# Patient Record
Sex: Female | Born: 1960
Health system: Southern US, Community
[De-identification: ages and names within clinical notes are randomized; demographics above are authoritative.]

## PROBLEM LIST (undated history)

## (undated) DIAGNOSIS — E7800 Pure hypercholesterolemia, unspecified: Secondary | ICD-10-CM

## (undated) HISTORY — PX: OTHER SURGICAL HISTORY: SHX169

---

## 1997-03-14 ENCOUNTER — Ambulatory Visit (HOSPITAL_COMMUNITY): Admission: RE | Admit: 1997-03-14 | Discharge: 1997-03-14 | Payer: Self-pay | Admitting: Obstetrics

## 1997-04-04 ENCOUNTER — Inpatient Hospital Stay (HOSPITAL_COMMUNITY): Admission: AD | Admit: 1997-04-04 | Discharge: 1997-04-04 | Payer: Self-pay | Admitting: Obstetrics

## 1997-05-16 ENCOUNTER — Other Ambulatory Visit: Admission: RE | Admit: 1997-05-16 | Discharge: 1997-05-16 | Payer: Self-pay | Admitting: Obstetrics

## 1997-05-24 ENCOUNTER — Encounter: Admission: RE | Admit: 1997-05-24 | Discharge: 1997-08-22 | Payer: Self-pay | Admitting: Family Medicine

## 1997-06-15 ENCOUNTER — Encounter (HOSPITAL_COMMUNITY): Admission: RE | Admit: 1997-06-15 | Discharge: 1997-06-18 | Payer: Self-pay | Admitting: *Deleted

## 1997-06-15 ENCOUNTER — Inpatient Hospital Stay (HOSPITAL_COMMUNITY): Admission: AD | Admit: 1997-06-15 | Discharge: 1997-06-18 | Payer: Self-pay | Admitting: Obstetrics & Gynecology

## 1998-07-22 ENCOUNTER — Encounter: Payer: Self-pay | Admitting: Family Medicine

## 1998-07-22 ENCOUNTER — Ambulatory Visit (HOSPITAL_COMMUNITY): Admission: RE | Admit: 1998-07-22 | Discharge: 1998-07-22 | Payer: Self-pay | Admitting: Family Medicine

## 2000-03-06 ENCOUNTER — Encounter: Payer: Self-pay | Admitting: Emergency Medicine

## 2000-03-06 ENCOUNTER — Emergency Department (HOSPITAL_COMMUNITY): Admission: EM | Admit: 2000-03-06 | Discharge: 2000-03-06 | Payer: Self-pay | Admitting: Emergency Medicine

## 2000-03-23 ENCOUNTER — Other Ambulatory Visit: Admission: RE | Admit: 2000-03-23 | Discharge: 2000-03-23 | Payer: Self-pay | Admitting: Family Medicine

## 2001-05-25 ENCOUNTER — Ambulatory Visit (HOSPITAL_COMMUNITY): Admission: RE | Admit: 2001-05-25 | Discharge: 2001-05-25 | Payer: Self-pay | Admitting: Family Medicine

## 2001-05-25 ENCOUNTER — Encounter: Payer: Self-pay | Admitting: Family Medicine

## 2006-08-17 ENCOUNTER — Other Ambulatory Visit: Admission: RE | Admit: 2006-08-17 | Discharge: 2006-08-17 | Payer: Self-pay | Admitting: Family Medicine

## 2007-08-18 ENCOUNTER — Other Ambulatory Visit: Admission: RE | Admit: 2007-08-18 | Discharge: 2007-08-18 | Payer: Self-pay | Admitting: Family Medicine

## 2008-08-20 ENCOUNTER — Other Ambulatory Visit: Admission: RE | Admit: 2008-08-20 | Discharge: 2008-08-20 | Payer: Self-pay | Admitting: Family Medicine

## 2011-10-27 ENCOUNTER — Emergency Department (HOSPITAL_BASED_OUTPATIENT_CLINIC_OR_DEPARTMENT_OTHER): Payer: Managed Care, Other (non HMO)

## 2011-10-27 ENCOUNTER — Emergency Department (HOSPITAL_BASED_OUTPATIENT_CLINIC_OR_DEPARTMENT_OTHER)
Admission: EM | Admit: 2011-10-27 | Discharge: 2011-10-27 | Disposition: A | Discharge status: Home / Self Care | Payer: Managed Care, Other (non HMO) | Attending: Emergency Medicine | Admitting: Emergency Medicine

## 2011-10-27 ENCOUNTER — Encounter (HOSPITAL_BASED_OUTPATIENT_CLINIC_OR_DEPARTMENT_OTHER): Payer: Self-pay | Admitting: Emergency Medicine

## 2011-10-27 DIAGNOSIS — R109 Unspecified abdominal pain: Secondary | ICD-10-CM | POA: Insufficient documentation

## 2011-10-27 DIAGNOSIS — K859 Acute pancreatitis without necrosis or infection, unspecified: Secondary | ICD-10-CM | POA: Insufficient documentation

## 2011-10-27 DIAGNOSIS — R319 Hematuria, unspecified: Secondary | ICD-10-CM | POA: Insufficient documentation

## 2011-10-27 DIAGNOSIS — R197 Diarrhea, unspecified: Secondary | ICD-10-CM | POA: Insufficient documentation

## 2011-10-27 DIAGNOSIS — R42 Dizziness and giddiness: Secondary | ICD-10-CM | POA: Insufficient documentation

## 2011-10-27 HISTORY — DX: Pure hypercholesterolemia, unspecified: E78.00

## 2011-10-27 LAB — COMPREHENSIVE METABOLIC PANEL
Albumin: 3.7 g/dL (ref 3.5–5.2)
Alkaline Phosphatase: 53 U/L (ref 39–117)
BUN: 12 mg/dL (ref 6–23)
Chloride: 102 mEq/L (ref 96–112)
GFR calc Af Amer: >90 mL/min (ref >90)
Glucose, Bld: 100 mg/dL — ABNORMAL HIGH (ref 70–99)
Potassium: 3.7 mEq/L (ref 3.5–5.1)
Total Bilirubin: 0.4 mg/dL (ref 0.3–1.2)

## 2011-10-27 LAB — URINALYSIS, ROUTINE W REFLEX MICROSCOPIC
Bilirubin Urine: NEGATIVE
Ketones, ur: NEGATIVE mg/dL
Nitrite: NEGATIVE
pH: 5 (ref 5.0–8.0)

## 2011-10-27 LAB — CBC WITH DIFFERENTIAL/PLATELET
Hemoglobin: 12.5 g/dL (ref 12.0–15.0)
Lymphs Abs: 1.6 10*3/uL (ref 0.7–4.0)
Monocytes Relative: 9 % (ref 3–12)
Neutro Abs: 3.5 10*3/uL (ref 1.7–7.7)
Neutrophils Relative %: 61 % (ref 43–77)
RBC: 3.95 MIL/uL (ref 3.87–5.11)

## 2011-10-27 LAB — URINE MICROSCOPIC-ADD ON

## 2011-10-27 LAB — LIPASE, BLOOD: Lipase: 82 U/L — ABNORMAL HIGH (ref 11–59)

## 2011-10-27 MED ORDER — OXYCODONE-ACETAMINOPHEN 5-325 MG PO TABS: 1.0000 | ORAL_TABLET | Freq: Four times a day (QID) | ORAL | Status: DC | PRN

## 2011-10-27 MED ORDER — SODIUM CHLORIDE 0.9 % IV BOLUS (SEPSIS)
1000.0000 mL | Freq: Once | INTRAVENOUS | Status: AC
  Administered 2011-10-27: 1000 mL via INTRAVENOUS

## 2011-10-27 MED ORDER — PROMETHAZINE HCL 25 MG RE SUPP: 12.5000 mg | Freq: Four times a day (QID) | RECTAL | Status: DC | PRN

## 2011-10-27 MED ORDER — FENTANYL CITRATE 0.05 MG/ML IJ SOLN
50.0000 ug | Freq: Once | INTRAMUSCULAR | Status: AC
  Administered 2011-10-27: 50 ug via INTRAVENOUS
  Filled 2011-10-27: qty 2

## 2011-10-27 MED ORDER — OXYCODONE-ACETAMINOPHEN 5-325 MG PO TABS: 1.0000 | ORAL_TABLET | ORAL | Status: DC | PRN

## 2011-10-27 MED ORDER — PROMETHAZINE HCL 25 MG RE SUPP: 25.0000 mg | Freq: Four times a day (QID) | RECTAL | Status: DC | PRN

## 2011-10-27 MED ORDER — ONDANSETRON HCL 4 MG/2ML IJ SOLN
4.0000 mg | Freq: Once | INTRAMUSCULAR | Status: AC
  Administered 2011-10-27: 4 mg via INTRAVENOUS
  Filled 2011-10-27: qty 2

## 2011-10-27 NOTE — ED Notes (Signed)
Presents via ems with n/v, dizziness

## 2011-10-27 NOTE — ED Provider Notes (Signed)
History     CSN: [id number]  Arrival date & time 10/27/11  4098   First MD Initiated Contact with Patient 10/27/11 [phone]      Chief Complaint  Patient presents with  . Abdominal Pain    (Consider location/radiation/quality/duration/timing/severity/associated sxs/prior treatment) Patient is a 51 y.o. female presenting with abdominal pain. The history is provided by the patient. No language interpreter was used.  Abdominal Pain The primary symptoms of the illness include abdominal pain, nausea, vomiting and diarrhea. The primary symptoms of the illness do not include fever, fatigue, shortness of breath or dysuria. The current episode started yesterday. The onset of the illness was gradual. The problem has been rapidly worsening.  The abdominal pain began yesterday. The pain came on gradually. The abdominal pain has been gradually worsening since its onset. The abdominal pain is located in the suprapubic region. The abdominal pain does not radiate. The severity of the abdominal pain is 7/10. The abdominal pain is relieved by vomiting (vomiting originally and now nothing.  ). Exacerbated by: nothing.  Nausea began yesterday. Associated with: nothing.  The vomiting began yesterday. Vomiting occurred once. The emesis contains stomach contents.  The diarrhea began today. The diarrhea is watery. The diarrhea occurs 2 to 4 times per day.  Associated with: nothing. The patient states that she believes she is currently not pregnant. Risk factors: none. Symptoms associated with the illness do not include chills, anorexia, diaphoresis or constipation. Significant associated medical issues do not include diabetes.  Originally had a bout of pain last evening relieved by emesis and patient went to be and then awoke with abdominal pain coming and going and 2 bouts of diarrhea.  No upper abdominal pain.  Patient points to suprapubic area as the source of her pain.  No f/c/r.  Denies back pain at this time.     Past Medical History  Diagnosis Date  . Coronary artery disease     History reviewed. No pertinent past surgical history.  History reviewed. No pertinent family history.  History  Substance Use Topics  . Smoking status: Never Smoker   . Smokeless tobacco: Never Used  . Alcohol Use: No    OB History    Grav Para Term Preterm Abortions TAB SAB Ect Mult Living                  Review of Systems  Constitutional: Negative for fever, chills, diaphoresis and fatigue.  Respiratory: Negative for shortness of breath.   Cardiovascular: Negative for chest pain.  Gastrointestinal: Positive for nausea, vomiting, abdominal pain and diarrhea. Negative for constipation and anorexia.  Genitourinary: Negative for dysuria.  Neurological: Positive for light-headedness. Negative for dizziness, weakness and numbness.  All other systems reviewed and are negative.    Allergies  Review of patient's allergies indicates no known allergies.  Home Medications  No current outpatient prescriptions on file.  BP 145/93  Pulse 77  Temp 98.4 F (36.9 C)  Resp 20  Ht 5\' 4"  (1.626 m)  Wt 162 lb (73.483 kg)  BMI 27.81 kg/m2  SpO2 99%  LMP 10/20/2011  Physical Exam  Constitutional: She is oriented to person, place, and time. She appears well-developed and well-nourished. No distress.  HENT:  Head: Normocephalic and atraumatic.  Mouth/Throat: Oropharynx is clear and moist.  Eyes: Conjunctivae normal are normal. Pupils are equal, round, and reactive to light.  Neck: Normal range of motion. Neck supple.  Cardiovascular: Normal rate and regular rhythm.   Pulmonary/Chest: Effort normal  and breath sounds normal. She has no wheezes. She has no rales.  Abdominal: Soft. She exhibits no distension, no fluid wave and no ascites. Bowel sounds are increased. There is no tenderness. There is no rebound and no guarding.  Musculoskeletal: Normal range of motion.       Gait normal  Neurological: She is  alert and oriented to person, place, and time.  Skin: Skin is warm and dry.  Psychiatric: She has a normal mood and affect.    ED Course  Procedures (including critical care time)   Labs Reviewed  CBC WITH DIFFERENTIAL  COMPREHENSIVE METABOLIC PANEL  LIPASE, BLOOD  URINALYSIS, ROUTINE W REFLEX MICROSCOPIC  PREGNANCY, URINE   No results found.   No diagnosis found.   Bartholins cyst on CT is stable or improved from 2005.  Will not intervene due to long standing duration.   MDM  Exam, and vitals reassuring.  Patient stated she has ongoing microscopic hematuria that she is aware of.  Patient is able to hold down orals in the ED.  Will place on clear liquids x 7 days with RX for phenergan and percocet.  Follow up with your family doctor in 2 days for recheck and labs.  Return for worsening pain, intractable vomiting or any concerns. Patient and daughter verbalize understanding and agree to follow up       Tametria Aho Smitty Cords, MD 10/27/11 [phone]

## 2011-11-09 ENCOUNTER — Other Ambulatory Visit: Payer: Self-pay | Admitting: Family Medicine

## 2011-11-09 DIAGNOSIS — R1011 Right upper quadrant pain: Secondary | ICD-10-CM

## 2011-11-10 ENCOUNTER — Ambulatory Visit
Admission: RE | Admit: 2011-11-10 | Discharge: 2011-11-10 | Disposition: A | Discharge status: Home / Self Care | Payer: Managed Care, Other (non HMO) | Source: Ambulatory Visit | Attending: Family Medicine | Admitting: Family Medicine

## 2011-11-10 DIAGNOSIS — R1011 Right upper quadrant pain: Secondary | ICD-10-CM

## 2011-12-02 ENCOUNTER — Other Ambulatory Visit (HOSPITAL_COMMUNITY)
Admission: RE | Admit: 2011-12-02 | Discharge: 2011-12-02 | Disposition: A | Discharge status: Home / Self Care | Payer: Managed Care, Other (non HMO) | Source: Ambulatory Visit | Attending: Obstetrics and Gynecology | Admitting: Obstetrics and Gynecology

## 2011-12-02 ENCOUNTER — Other Ambulatory Visit: Payer: Self-pay | Admitting: Obstetrics and Gynecology

## 2011-12-02 DIAGNOSIS — Z01419 Encounter for gynecological examination (general) (routine) without abnormal findings: Secondary | ICD-10-CM | POA: Insufficient documentation

## 2012-07-07 ENCOUNTER — Other Ambulatory Visit: Payer: Self-pay | Admitting: Otolaryngology

## 2012-07-07 DIAGNOSIS — R42 Dizziness and giddiness: Secondary | ICD-10-CM

## 2012-07-07 DIAGNOSIS — H9190 Unspecified hearing loss, unspecified ear: Secondary | ICD-10-CM

## 2012-09-13 ENCOUNTER — Ambulatory Visit
Admission: RE | Admit: 2012-09-13 | Discharge: 2012-09-13 | Disposition: A | Discharge status: Home / Self Care | Payer: Managed Care, Other (non HMO) | Source: Ambulatory Visit | Attending: Family Medicine | Admitting: Family Medicine

## 2012-09-13 ENCOUNTER — Other Ambulatory Visit: Payer: Self-pay | Admitting: Family Medicine

## 2012-09-13 DIAGNOSIS — M542 Cervicalgia: Secondary | ICD-10-CM

## 2012-09-13 DIAGNOSIS — M25552 Pain in left hip: Secondary | ICD-10-CM

## 2012-09-13 DIAGNOSIS — M79675 Pain in left toe(s): Secondary | ICD-10-CM

## 2014-02-09 ENCOUNTER — Other Ambulatory Visit: Payer: Self-pay | Admitting: Obstetrics and Gynecology

## 2014-02-09 ENCOUNTER — Other Ambulatory Visit (HOSPITAL_COMMUNITY)
Admission: RE | Admit: 2014-02-09 | Discharge: 2014-02-09 | Disposition: A | Discharge status: Home / Self Care | Payer: BLUE CROSS/BLUE SHIELD | Source: Ambulatory Visit | Attending: Obstetrics and Gynecology | Admitting: Obstetrics and Gynecology

## 2014-02-09 DIAGNOSIS — Z01419 Encounter for gynecological examination (general) (routine) without abnormal findings: Secondary | ICD-10-CM | POA: Insufficient documentation

## 2014-02-09 DIAGNOSIS — Z1151 Encounter for screening for human papillomavirus (HPV): Secondary | ICD-10-CM | POA: Diagnosis present

## 2014-02-12 LAB — CYTOLOGY - PAP

## 2015-02-26 ENCOUNTER — Other Ambulatory Visit: Payer: Self-pay | Admitting: Obstetrics and Gynecology

## 2015-02-26 ENCOUNTER — Other Ambulatory Visit (HOSPITAL_COMMUNITY)
Admission: RE | Admit: 2015-02-26 | Discharge: 2015-02-26 | Disposition: A | Discharge status: Home / Self Care | Payer: BLUE CROSS/BLUE SHIELD | Source: Ambulatory Visit | Attending: Obstetrics and Gynecology | Admitting: Obstetrics and Gynecology

## 2015-02-26 DIAGNOSIS — Z01419 Encounter for gynecological examination (general) (routine) without abnormal findings: Secondary | ICD-10-CM | POA: Diagnosis not present

## 2015-03-01 LAB — CYTOLOGY - PAP

## 2015-11-13 DIAGNOSIS — Z1211 Encounter for screening for malignant neoplasm of colon: Secondary | ICD-10-CM | POA: Diagnosis not present

## 2015-11-13 DIAGNOSIS — Z0000 Encounter for general adult medical examination without abnormal findings: Secondary | ICD-10-CM | POA: Diagnosis not present

## 2015-11-13 DIAGNOSIS — E785 Hyperlipidemia, unspecified: Secondary | ICD-10-CM | POA: Diagnosis not present

## 2015-11-13 DIAGNOSIS — E559 Vitamin D deficiency, unspecified: Secondary | ICD-10-CM | POA: Diagnosis not present

## 2015-11-13 DIAGNOSIS — Z23 Encounter for immunization: Secondary | ICD-10-CM | POA: Diagnosis not present

## 2016-02-22 DIAGNOSIS — Z1231 Encounter for screening mammogram for malignant neoplasm of breast: Secondary | ICD-10-CM | POA: Diagnosis not present

## 2016-02-27 DIAGNOSIS — Z01411 Encounter for gynecological examination (general) (routine) with abnormal findings: Secondary | ICD-10-CM | POA: Diagnosis not present

## 2016-02-27 DIAGNOSIS — R140 Abdominal distension (gaseous): Secondary | ICD-10-CM | POA: Diagnosis not present

## 2016-03-12 DIAGNOSIS — R140 Abdominal distension (gaseous): Secondary | ICD-10-CM | POA: Diagnosis not present

## 2016-04-02 ENCOUNTER — Other Ambulatory Visit: Payer: Self-pay | Admitting: Family Medicine

## 2016-04-02 DIAGNOSIS — R140 Abdominal distension (gaseous): Secondary | ICD-10-CM

## 2016-04-02 DIAGNOSIS — R1010 Upper abdominal pain, unspecified: Secondary | ICD-10-CM

## 2016-04-14 ENCOUNTER — Other Ambulatory Visit: Payer: Managed Care, Other (non HMO)

## 2016-04-17 ENCOUNTER — Ambulatory Visit
Admission: RE | Admit: 2016-04-17 | Discharge: 2016-04-17 | Disposition: A | Discharge status: Home / Self Care | Payer: BLUE CROSS/BLUE SHIELD | Source: Ambulatory Visit | Attending: Family Medicine | Admitting: Family Medicine

## 2016-04-17 DIAGNOSIS — R1010 Upper abdominal pain, unspecified: Secondary | ICD-10-CM

## 2016-04-17 DIAGNOSIS — R140 Abdominal distension (gaseous): Secondary | ICD-10-CM | POA: Diagnosis not present

## 2016-04-17 MED ORDER — IOPAMIDOL (ISOVUE-300) INJECTION 61%
100.0000 mL | Freq: Once | INTRAVENOUS | Status: AC | PRN
  Administered 2016-04-17: 100 mL via INTRAVENOUS

## 2016-06-09 DIAGNOSIS — J101 Influenza due to other identified influenza virus with other respiratory manifestations: Secondary | ICD-10-CM | POA: Diagnosis not present

## 2016-06-12 DIAGNOSIS — J0110 Acute frontal sinusitis, unspecified: Secondary | ICD-10-CM | POA: Diagnosis not present

## 2016-06-12 DIAGNOSIS — J111 Influenza due to unidentified influenza virus with other respiratory manifestations: Secondary | ICD-10-CM | POA: Diagnosis not present

## 2016-09-16 DIAGNOSIS — H01009 Unspecified blepharitis unspecified eye, unspecified eyelid: Secondary | ICD-10-CM | POA: Diagnosis not present

## 2016-09-16 DIAGNOSIS — H16223 Keratoconjunctivitis sicca, not specified as Sjogren's, bilateral: Secondary | ICD-10-CM | POA: Diagnosis not present

## 2016-10-20 DIAGNOSIS — H16223 Keratoconjunctivitis sicca, not specified as Sjogren's, bilateral: Secondary | ICD-10-CM | POA: Diagnosis not present

## 2016-10-20 DIAGNOSIS — H01009 Unspecified blepharitis unspecified eye, unspecified eyelid: Secondary | ICD-10-CM | POA: Diagnosis not present

## 2016-10-20 DIAGNOSIS — H04123 Dry eye syndrome of bilateral lacrimal glands: Secondary | ICD-10-CM | POA: Diagnosis not present

## 2016-11-19 ENCOUNTER — Other Ambulatory Visit: Payer: Self-pay

## 2016-11-19 ENCOUNTER — Emergency Department (HOSPITAL_COMMUNITY): Payer: BLUE CROSS/BLUE SHIELD

## 2016-11-19 ENCOUNTER — Encounter (HOSPITAL_COMMUNITY): Payer: Self-pay | Admitting: Emergency Medicine

## 2016-11-19 DIAGNOSIS — R079 Chest pain, unspecified: Secondary | ICD-10-CM | POA: Diagnosis not present

## 2016-11-19 DIAGNOSIS — R7301 Impaired fasting glucose: Secondary | ICD-10-CM | POA: Insufficient documentation

## 2016-11-19 DIAGNOSIS — E785 Hyperlipidemia, unspecified: Secondary | ICD-10-CM | POA: Insufficient documentation

## 2016-11-19 DIAGNOSIS — I10 Essential (primary) hypertension: Secondary | ICD-10-CM | POA: Insufficient documentation

## 2016-11-19 DIAGNOSIS — Z79899 Other long term (current) drug therapy: Secondary | ICD-10-CM | POA: Insufficient documentation

## 2016-11-19 DIAGNOSIS — R0789 Other chest pain: Principal | ICD-10-CM | POA: Insufficient documentation

## 2016-11-19 DIAGNOSIS — R0602 Shortness of breath: Secondary | ICD-10-CM | POA: Diagnosis not present

## 2016-11-19 DIAGNOSIS — R0609 Other forms of dyspnea: Secondary | ICD-10-CM | POA: Diagnosis not present

## 2016-11-19 LAB — BASIC METABOLIC PANEL
Anion gap: 8 (ref 5–15)
BUN: 9 mg/dL (ref 6–20)
CALCIUM: 9.2 mg/dL (ref 8.9–10.3)
CO2: 27 mmol/L (ref 22–32)
CREATININE: 0.81 mg/dL (ref 0.44–1.00)
Chloride: 105 mmol/L (ref 101–111)
Glucose, Bld: 124 mg/dL — ABNORMAL HIGH (ref 65–99)
Potassium: 3.6 mmol/L (ref 3.5–5.1)
SODIUM: 140 mmol/L (ref 135–145)

## 2016-11-19 LAB — CBC
HCT: 37.3 % (ref 36.0–46.0)
Hemoglobin: 12.6 g/dL (ref 12.0–15.0)
MCH: 29.3 pg (ref 26.0–34.0)
MCHC: 33.8 g/dL (ref 30.0–36.0)
MCV: 86.7 fL (ref 78.0–100.0)
PLATELETS: 219 10*3/uL (ref 150–400)
RBC: 4.3 MIL/uL (ref 3.87–5.11)
RDW: 13.7 % (ref 11.5–15.5)
WBC: 6.7 10*3/uL (ref 4.0–10.5)

## 2016-11-19 LAB — I-STAT TROPONIN, ED: Troponin i, poc: 0 ng/mL (ref 0.00–0.08)

## 2016-11-19 NOTE — ED Triage Notes (Signed)
Pt presents to the ED with central chest pain and SOB. Pt states this has been off and on for 1-2 months but worsened these past few days. Pt denies any dizziness, N/V. Reports no chest pain at this time.

## 2016-11-20 ENCOUNTER — Encounter (HOSPITAL_COMMUNITY): Payer: Self-pay | Admitting: Physician Assistant

## 2016-11-20 ENCOUNTER — Observation Stay (HOSPITAL_COMMUNITY)
Admission: EM | Admit: 2016-11-20 | Discharge: 2016-11-21 | Disposition: A | Discharge status: Home / Self Care | Payer: BLUE CROSS/BLUE SHIELD | Attending: Internal Medicine | Admitting: Internal Medicine

## 2016-11-20 ENCOUNTER — Observation Stay (HOSPITAL_COMMUNITY): Payer: BLUE CROSS/BLUE SHIELD

## 2016-11-20 DIAGNOSIS — R7301 Impaired fasting glucose: Secondary | ICD-10-CM

## 2016-11-20 DIAGNOSIS — I10 Essential (primary) hypertension: Secondary | ICD-10-CM | POA: Diagnosis not present

## 2016-11-20 DIAGNOSIS — E785 Hyperlipidemia, unspecified: Secondary | ICD-10-CM

## 2016-11-20 DIAGNOSIS — E7800 Pure hypercholesterolemia, unspecified: Secondary | ICD-10-CM | POA: Diagnosis not present

## 2016-11-20 DIAGNOSIS — R0789 Other chest pain: Secondary | ICD-10-CM | POA: Diagnosis not present

## 2016-11-20 DIAGNOSIS — R079 Chest pain, unspecified: Secondary | ICD-10-CM | POA: Diagnosis present

## 2016-11-20 DIAGNOSIS — R0602 Shortness of breath: Secondary | ICD-10-CM | POA: Diagnosis not present

## 2016-11-20 LAB — LIPID PANEL
CHOL/HDL RATIO: 3.4 ratio
CHOLESTEROL: 196 mg/dL (ref 0–200)
HDL: 58 mg/dL (ref >40)
LDL Cholesterol: 132 mg/dL — ABNORMAL HIGH (ref 0–99)
Triglycerides: 30 mg/dL (ref <150)
VLDL: 6 mg/dL (ref 0–40)

## 2016-11-20 LAB — TROPONIN I: Troponin I: <0.03 ng/mL (ref <0.03)

## 2016-11-20 LAB — HEMOGLOBIN A1C
HEMOGLOBIN A1C: 5.3 % (ref 4.8–5.6)
MEAN PLASMA GLUCOSE: 105.41 mg/dL

## 2016-11-20 LAB — HIV ANTIBODY (ROUTINE TESTING W REFLEX): HIV Screen 4th Generation wRfx: NONREACTIVE

## 2016-11-20 LAB — I-STAT TROPONIN, ED: TROPONIN I, POC: 0 ng/mL (ref 0.00–0.08)

## 2016-11-20 LAB — GLUCOSE, CAPILLARY: Glucose-Capillary: 96 mg/dL (ref 65–99)

## 2016-11-20 LAB — TSH: TSH: 0.965 u[IU]/mL (ref 0.350–4.500)

## 2016-11-20 LAB — D-DIMER, QUANTITATIVE: D-Dimer, Quant: 0.56 ug/mL-FEU — ABNORMAL HIGH (ref 0.00–0.50)

## 2016-11-20 MED ORDER — SODIUM CHLORIDE 0.9 % IV SOLN
INTRAVENOUS | Status: DC
  Administered 2016-11-20: 11:00:00 via INTRAVENOUS

## 2016-11-20 MED ORDER — ONDANSETRON HCL 4 MG/2ML IJ SOLN: 4.0000 mg | Freq: Four times a day (QID) | INTRAMUSCULAR | Status: DC | PRN

## 2016-11-20 MED ORDER — IOPAMIDOL (ISOVUE-370) INJECTION 76%
INTRAVENOUS | Status: AC
  Administered 2016-11-20: 100 mL
  Filled 2016-11-20: qty 100

## 2016-11-20 MED ORDER — MORPHINE SULFATE (PF) 4 MG/ML IV SOLN: 1.0000 mg | INTRAVENOUS | Status: DC | PRN

## 2016-11-20 MED ORDER — SODIUM CHLORIDE 0.9 % IV SOLN
INTRAVENOUS | Status: AC
  Administered 2016-11-20: 15:00:00 via INTRAVENOUS

## 2016-11-20 MED ORDER — ALPRAZOLAM 0.25 MG PO TABS: 0.2500 mg | ORAL_TABLET | Freq: Two times a day (BID) | ORAL | Status: DC | PRN

## 2016-11-20 MED ORDER — ASPIRIN 81 MG PO CHEW
324.0000 mg | CHEWABLE_TABLET | Freq: Once | ORAL | Status: AC
  Administered 2016-11-20: 324 mg via ORAL
  Filled 2016-11-20: qty 4

## 2016-11-20 MED ORDER — HEPARIN SODIUM (PORCINE) 5000 UNIT/ML IJ SOLN
5000.0000 [IU] | Freq: Three times a day (TID) | INTRAMUSCULAR | Status: DC
  Administered 2016-11-20 – 2016-11-21 (×5): 5000 [IU] via SUBCUTANEOUS
  Filled 2016-11-20 (×5): qty 1

## 2016-11-20 MED ORDER — ACETAMINOPHEN 325 MG PO TABS: 650.0000 mg | ORAL_TABLET | ORAL | Status: DC | PRN

## 2016-11-20 MED ORDER — INFLUENZA VAC SPLIT QUAD 0.5 ML IM SUSY: 0.5000 mL | PREFILLED_SYRINGE | INTRAMUSCULAR | Status: DC

## 2016-11-20 MED ORDER — ASPIRIN EC 325 MG PO TBEC
325.0000 mg | DELAYED_RELEASE_TABLET | Freq: Every day | ORAL | Status: DC
  Administered 2016-11-21: 325 mg via ORAL
  Filled 2016-11-20: qty 1

## 2016-11-20 MED ORDER — PRAVASTATIN SODIUM 40 MG PO TABS
40.0000 mg | ORAL_TABLET | Freq: Every day | ORAL | Status: DC
  Administered 2016-11-20 – 2016-11-21 (×2): 40 mg via ORAL
  Filled 2016-11-20 (×2): qty 1

## 2016-11-20 MED ORDER — GI COCKTAIL ~~LOC~~: 30.0000 mL | Freq: Four times a day (QID) | ORAL | Status: DC | PRN

## 2016-11-20 MED ORDER — HYDRALAZINE HCL 20 MG/ML IJ SOLN: 5.0000 mg | Freq: Three times a day (TID) | INTRAMUSCULAR | Status: DC | PRN

## 2016-11-20 NOTE — ED Provider Notes (Signed)
[facility]   CSN: [id number] Arrival date & time: 11/19/16  2101     History   Chief Complaint Chief Complaint  Patient presents with  . Chest Pain    HPI Leah Harvey is a 56 y.o. female.  The history is provided by the patient.  She has been having episodes of pressure feeling in her mid chest with associated shortness of breath and palpitations.  This is been happening over the last 2 months.  Pressure feeling lasts only a few seconds to a minute, and is followed by a sense that her heart is racing and skipping beats.  There is no dyspnea, nausea, vomiting, diaphoresis.  Symptoms are not clearly exertional.  She has not taken anything to treat these.  She does have history of hyperlipidemia but no history of tobacco abuse, hypertension, diabetes, and no family history of premature coronary atherosclerosis.  Past Medical History:  Diagnosis Date  . Hypercholesteremia     There are no active problems to display for this patient.   History reviewed. No pertinent surgical history.  OB History    No data available       Home Medications    Prior to Admission medications   Medication Sig Start Date End Date Taking? Authorizing Provider  Cholecalciferol (VITAMIN D PO) Take 1 tablet by mouth daily.   Yes [provider]  Multiple Vitamin (MULTIVITAMIN WITH MINERALS) TABS tablet Take 1 tablet by mouth daily.   Yes [provider]  Omega-3 Fatty Acids (FISH OIL PO) Take 1 tablet by mouth daily.   Yes [provider]  pravastatin (PRAVACHOL) 40 MG tablet Take 40 mg by mouth daily.   Yes [provider]  oxyCODONE-acetaminophen (PERCOCET) 5-325 MG per tablet Take 1 tablet by mouth every 4 (four) hours as needed for pain. Patient not taking: Reported on 11/20/2016 10/27/11   Palumbo, April, MD  promethazine (PHENERGAN) 25 MG suppository Place 0.5 suppositories (12.5 mg total) rectally every  6 (six) hours as needed for nausea. Patient not taking: Reported on 11/20/2016 10/27/11   Nicanor AlconPalumbo, April, MD    Family History No family history on file.  Social History Social History  Substance Use Topics  . Smoking status: Never Smoker  . Smokeless tobacco: Never Used  . Alcohol use No     Allergies   Patient has no known allergies.   Review of Systems Review of Systems  All other systems reviewed and are negative.    Physical Exam Updated Vital Signs BP (!) 147/84   Pulse 68   Temp 98.6 F (37 C) (Oral)   Resp 12   Ht 5\' 3"  (1.6 m)   Wt 81.6 kg (180 lb)   SpO2 100%   BMI 31.89 kg/m   Physical Exam  Nursing note and vitals reviewed.  56 year old female, resting comfortably and in no acute distress. Vital signs are significant for borderline hypertension. Oxygen saturation is 100%, which is normal. Head is normocephalic and atraumatic. PERRLA, EOMI. Oropharynx is clear. Neck is nontender and supple without adenopathy or JVD. Back is nontender and there is no CVA tenderness. Lungs are clear without rales, wheezes, or rhonchi. Chest is nontender. Heart has regular rate and rhythm without murmur. Abdomen is soft, flat, nontender without masses or hepatosplenomegaly and peristalsis is normoactive. Extremities have no cyanosis or edema, full range of motion is present. Skin is warm and dry without rash. Neurologic: Mental status is normal, cranial  nerves are intact, there are no motor or sensory deficits.  ED Treatments / Results  Labs (all labs ordered are listed, but only abnormal results are displayed) Labs Reviewed  BASIC METABOLIC PANEL - Abnormal; Notable for the following:       Result Value   Glucose, Bld 124 (*)    All other components within normal limits  CBC  I-STAT TROPONIN, ED  I-STAT TROPONIN, ED    EKG  EKG Interpretation  Date/Time:  Thursday November 19 2016 20:59:11 EDT Ventricular Rate:  73 PR Interval:  144 QRS Duration: 80 QT  Interval:  376 QTC Calculation: 414 R Axis:   18 Text Interpretation:  Normal sinus rhythm ST & T wave abnormality, consider inferior ischemia Abnormal ECG When compared with ECG of 03/06/2000, ST-t abnormality is now present Confirmed by Dione Booze ([id number]) on 11/19/2016 11:32:24 PM       Radiology Dg Chest 2 View  Result Date: 11/19/2016 CLINICAL DATA:  Central chest pain and shortness of breath, off and on for 2 months. Worse over the past few days. Weakness. Nonsmoker. EXAM: CHEST  2 VIEW COMPARISON:  None. FINDINGS: The heart size and mediastinal contours are within normal limits. Both lungs are clear. The visualized skeletal structures are unremarkable. IMPRESSION: No active cardiopulmonary disease. Electronically Signed   By: Burman Nieves M.D.   On: 11/19/2016 21:46    Procedures Procedures (including critical care time)  Medications Ordered in ED Medications  aspirin chewable tablet 324 mg (324 mg Oral Given 11/20/16 0556)    Initial Impression / Assessment and Plan / ED Course  I have reviewed the triage vital signs and the nursing notes.  Pertinent labs & imaging results that were available during my care of the patient were reviewed by me and considered in my medical decision making (see chart for details).  Chest discomfort that is worrisome for coronary artery disease.  ECG shows new ST and T changes, although prior ECG was in 2002.  Heart score is 4, which puts her at moderate risk for major adverse cardiac event in the next 30 days.  Although I have 2 negative troponins, I feel she should be admitted for further observation and cardiology consultation.  Case is discussed with Dr. Julian Reil of [facility] who agrees to admit the patient.  Final Clinical Impressions(s) / ED Diagnoses   Final diagnoses:  Chest pain, unspecified type    New Prescriptions New Prescriptions   No medications on file     Dione Booze, MD 11/20/16 [phone]

## 2016-11-20 NOTE — Progress Notes (Signed)
Patient was admitted from ED around 1615H with no apparent distress noted. Denied any chest pain/disccomfort. Orientation to safety done and patient verbalized understanding.

## 2016-11-20 NOTE — ED Notes (Signed)
Returned from ct scan and back on monitor. Family at bedside

## 2016-11-20 NOTE — H&P (Signed)
History and Physical    Leah Harvey ZOX:[id number] DOB: 1960/04/20 DOA: 11/20/2016   PCP: Laurann Montana, MD   Patient coming from:  Home    Chief Complaint: Chest pain   HPI: Leah Harvey is a 56 y.o. female with medical history significant for HLD, presenting with 2 month history of "pressure feeling in the mid chest ", a combining by intermittent palpitations, lasting about a few seconds to 1 minute, but has been happening more frequently. She feels at times the heart is racing or skipping heart beat, taking a deep breath to relieve this symptom. She denies any dyspnea, nausea, diaphoresis, or vomiting.This discomfort is not worse with deep inspiration. The symptoms are not clearly exertional. She reports it can happen when sleeping, or seeting at the desk. She does not take aspirin. Denies any history of tobacco, alcohol, recreational drugs,caffeine, hypertension, or diabetes. She denies any family history of heart disease. She has not been seen by her primary physician regarding this issues, and has never had a cardiology evaluation, or catheterization.  Denies any dizziness or falls. No syncope or presyncope.  Denies any fever or chills. Denies abdominal pain. Appetite is normal andeats salt rich foods. Denies history of GERD. Denies any leg swelling or calf pain. Denies any headaches or vision changes. No recent long distance trips. Denies any new stressors. No new meds, hormonal therapy or herbal supplements. She reports exercising daily.  ED Course:  BP (!) 147/84   Pulse 68   Temp 98.6 F (37 C) (Oral)   Resp 12   Ht 5\' 3"  (1.6 m)   Wt 81.6 kg (180 lb)   SpO2 100%   BMI 31.89 kg/m    EKG;Normal sinus rhythm ST & T wave abnormality, consider inferior ischemia Abnormal ECG When compared with ECG of 03/06/2000, ST-t abnormality is now present Received aspirin in the ER with some improvement of her symptoms. Troponin 2 is negative Glucose 124 Chest x-ray is negative for acute  disease CBC, and CMP  are normal  Review of Systems:  As per HPI otherwise all other systems reviewed and are negative  Past Medical History:  Diagnosis Date  . Hypercholesteremia     History reviewed. No pertinent surgical history.  Social History Social History   Social History  . Marital status: Divorced    Spouse name: N/A  . Number of children: N/A  . Years of education: N/A   Occupational History  . Not on file.   Social History Main Topics  . Smoking status: Never Smoker  . Smokeless tobacco: Never Used  . Alcohol use No  . Drug use: No  . Sexual activity: Not Currently   Other Topics Concern  . Not on file   Social History Narrative  . No narrative on file     No Known Allergies  History reviewed. No pertinent family history.    Prior to Admission medications   Medication Sig Start Date End Date Taking? Authorizing Provider  Cholecalciferol (VITAMIN D PO) Take 1 tablet by mouth daily.   Yes [provider]  Multiple Vitamin (MULTIVITAMIN WITH MINERALS) TABS tablet Take 1 tablet by mouth daily.   Yes [provider]  Omega-3 Fatty Acids (FISH OIL PO) Take 1 tablet by mouth daily.   Yes [provider]  pravastatin (PRAVACHOL) 40 MG tablet Take 40 mg by mouth daily.   Yes [provider]    Physical Exam:  Vitals:   11/19/16 2108 11/20/16 8119 11/20/16 0451  11/20/16 0500  BP:  126/85 (!) 158/83 (!) 147/84  Pulse:  69 69 68  Resp:  18 20 12   Temp:  98.6 F (37 C)    TempSrc:  Oral    SpO2:  100% 100% 100%  Weight: 81.6 kg (180 lb)     Height: 5\' 3"  (1.6 m)      Constitutional: NAD, calm, comfortable at the time of evaluation Eyes: PERRL, lids and conjunctivae normal ENMT: Mucous membranes are moist, without exudate or lesions  Neck: normal, supple, no masses, no thyromegaly Respiratory: clear to auscultation bilaterally, no wheezing, no crackles. Normal respiratory effort  Cardiovascular: Regular rate and  rhythm,  murmur, rubs or gallops. Occasional ectopic beat,  No extremity edema. 2+ pedal pulses. No carotid bruits.  Abdomen: Soft, obese non tender, No hepatosplenomegaly. Bowel sounds positive.  Musculoskeletal: no clubbing / cyanosis. Moves all extremities. Cannot reproduce chest wall pain  Skin: no jaundice, No lesions.  Neurologic: Sensation intact  Strength equal in all extremities Psychiatric:   Alert and oriented x 3. Normal mood.     Labs on Admission: I have personally reviewed following labs and imaging studies  CBC:  Recent Labs Lab 11/19/16 2109  WBC 6.7  HGB 12.6  HCT 37.3  MCV 86.7  PLT 219    Basic Metabolic Panel:  Recent Labs Lab 11/19/16 2109  NA 140  K 3.6  CL 105  CO2 27  GLUCOSE 124*  BUN 9  CREATININE 0.81  CALCIUM 9.2    GFR: Estimated Creatinine Clearance: 76.6 mL/min (by C-G formula based on SCr of 0.81 mg/dL).  Liver Function Tests: No results for input(s): AST, ALT, ALKPHOS, BILITOT, PROT, ALBUMIN in the last 168 hours. No results for input(s): LIPASE, AMYLASE in the last 168 hours. No results for input(s): AMMONIA in the last 168 hours.  Coagulation Profile: No results for input(s): INR, PROTIME in the last 168 hours.  Cardiac Enzymes: No results for input(s): CKTOTAL, CKMB, CKMBINDEX, TROPONINI in the last 168 hours.  BNP (last 3 results) No results for input(s): PROBNP in the last 8760 hours.  HbA1C: No results for input(s): HGBA1C in the last 72 hours.  CBG: No results for input(s): GLUCAP in the last 168 hours.  Lipid Profile: No results for input(s): CHOL, HDL, LDLCALC, TRIG, CHOLHDL, LDLDIRECT in the last 72 hours.  Thyroid Function Tests: No results for input(s): TSH, T4TOTAL, FREET4, T3FREE, THYROIDAB in the last 72 hours.  Anemia Panel: No results for input(s): VITAMINB12, FOLATE, FERRITIN, TIBC, IRON, RETICCTPCT in the last 72 hours.  Urine analysis:    Component Value Date/Time   COLORURINE YELLOW  10/27/2011 0527   APPEARANCEUR CLEAR 10/27/2011 0527   LABSPEC 1.014 10/27/2011 0527   PHURINE 5.0 10/27/2011 0527   GLUCOSEU NEGATIVE 10/27/2011 0527   HGBUR LARGE (A) 10/27/2011 0527   BILIRUBINUR NEGATIVE 10/27/2011 0527   KETONESUR NEGATIVE 10/27/2011 0527   PROTEINUR 30 (A) 10/27/2011 0527   UROBILINOGEN 0.2 10/27/2011 0527   NITRITE NEGATIVE 10/27/2011 0527   LEUKOCYTESUR NEGATIVE 10/27/2011 0527    Sepsis Labs: @LABRCNTIP (procalcitonin:4,lacticidven:4) )No results found for this or any previous visit (from the past 240 hour(s)).   Radiological Exams on Admission: Dg Chest 2 View  Result Date: 11/19/2016 CLINICAL DATA:  Central chest pain and shortness of breath, off and on for 2 months. Worse over the past few days. Weakness. Nonsmoker. EXAM: CHEST  2 VIEW COMPARISON:  None. FINDINGS: The heart size and mediastinal contours are within normal limits.  Both lungs are clear. The visualized skeletal structures are unremarkable. IMPRESSION: No active cardiopulmonary disease. Electronically Signed   By: Burman NievesWilliam  Stevens M.D.   On: 11/19/2016 21:46    EKG: Independently reviewed.  Assessment/Plan Active Problems:   Chest pain, rule out acute myocardial infarction   Hyperlipidemia   Elevated fasting glucose  Chest pain syndrome, cardiac versus musculoskeletal vs anxiety HEART score 4 . Troponin neg x2  , EKG SR with ST abnormalities, consider inferior ischemia CP somewhat relieved with  aspirin. CXR unrevealing. CBC, and CMP  are normal   Risk factors include obesity, elevated fasting glucose, HLD. SHe has also been found to have HTN at the ED  Telemetry/ Observation Chest pain order set Cycle troponins EKG in am continue ASA, O2 and NTG as needed Statins  GI cocktail Check Lipid panel  Hb A1C Check DDimer  Check TSH Will order Holter Monitor to be picked up at Cards office on Monday assuming patient is discharged today  Hyperlipidemia Continue home statins Lipid  panel pending   Elevated Glucose Patient noted to have elevated Glucose in labs at 124 . No prior documented history of DM Check A1C Check CBG bid  If continues to be elevated pending on A1C will place SSI    Hypertension BP 139/85   Pulse 74 in the setting of chest pain. No prior history of HTN.  Continue to monitor for now, may need to be d/c with oral antihypertensive and f/u by PCP for secondary hypertension  Add Hydralazine Q6 hours as needed for BP 160/90    DVT prophylaxis:  Heparin sq Code Status:    Full  Family Communication:  Discussed with patient Disposition Plan: Expect patient to be discharged to home after condition improves Consults called:    None Admission status: Tele Obs    Leah Harvey E, PA-C [facility]   11/20/2016, 7:14 AM

## 2016-11-20 NOTE — ED Notes (Signed)
Patient transported to CT 

## 2016-11-20 NOTE — ED Notes (Signed)
Paged admitting and informed of D-Dimer 0.56

## 2016-11-21 ENCOUNTER — Observation Stay (HOSPITAL_BASED_OUTPATIENT_CLINIC_OR_DEPARTMENT_OTHER): Payer: BLUE CROSS/BLUE SHIELD

## 2016-11-21 ENCOUNTER — Encounter (HOSPITAL_COMMUNITY): Payer: Self-pay | Admitting: Cardiology

## 2016-11-21 ENCOUNTER — Other Ambulatory Visit: Payer: Self-pay

## 2016-11-21 DIAGNOSIS — E782 Mixed hyperlipidemia: Secondary | ICD-10-CM | POA: Diagnosis not present

## 2016-11-21 DIAGNOSIS — R7301 Impaired fasting glucose: Secondary | ICD-10-CM | POA: Diagnosis not present

## 2016-11-21 DIAGNOSIS — R0789 Other chest pain: Secondary | ICD-10-CM | POA: Diagnosis not present

## 2016-11-21 DIAGNOSIS — I361 Nonrheumatic tricuspid (valve) insufficiency: Secondary | ICD-10-CM

## 2016-11-21 DIAGNOSIS — E785 Hyperlipidemia, unspecified: Secondary | ICD-10-CM | POA: Diagnosis not present

## 2016-11-21 DIAGNOSIS — R079 Chest pain, unspecified: Secondary | ICD-10-CM

## 2016-11-21 DIAGNOSIS — I10 Essential (primary) hypertension: Secondary | ICD-10-CM | POA: Diagnosis not present

## 2016-11-21 DIAGNOSIS — R002 Palpitations: Secondary | ICD-10-CM | POA: Diagnosis not present

## 2016-11-21 LAB — GLUCOSE, CAPILLARY
GLUCOSE-CAPILLARY: 79 mg/dL (ref 65–99)
Glucose-Capillary: 90 mg/dL (ref 65–99)
Glucose-Capillary: 78 mg/dL (ref 65–99)

## 2016-11-21 LAB — ECHOCARDIOGRAM COMPLETE
HEIGHTINCHES: 63 in
WEIGHTICAEL: 2857.16 [oz_av]

## 2016-11-21 MED ORDER — METOPROLOL SUCCINATE ER 25 MG PO TB24
25.0000 mg | ORAL_TABLET | Freq: Every day | ORAL | Status: DC
  Administered 2016-11-21: 25 mg via ORAL
  Filled 2016-11-21: qty 1

## 2016-11-21 MED ORDER — METOPROLOL SUCCINATE ER 25 MG PO TB24: 25.0000 mg | ORAL_TABLET | Freq: Every day | ORAL | 2 refills | Status: DC

## 2016-11-21 NOTE — Consult Note (Signed)
Cardiology Consultation:   Patient ID: Leah Harvey; [id number]; 1960/07/23   Admit date: 11/20/2016 Date of Consult: 11/21/2016  Primary Care Provider: Laurann Montana, MD Primary Cardiologist: Dr Jens Som   Patient Profile:   Leah Harvey is a 56 y.o. female with a hx of Hyperlipidemia who is being seen today for the evaluation of CP and palpitations at the request of Richarda Overlie MD.  History of Present Illness:   Leah Harvey is a pleasant 56 year old female with no prior cardiac history who we are asked to evaluate for chest pain and palpitations. She is originally from [place]. She typically does not have dyspnea on exertion, orthopnea, PND, pedal edema, exertional chest pain or syncope. For the past 2 months she has noted episodes of sudden onset palpitations followed by chest pain. The symptoms last from seconds to 1 minute. They resolve spontaneously. Her chest pain does not radiate. No associated nausea, diaphoresis, dyspnea or syncope. She presented with an episode and has ruled out. Cardiology now asked to evaluate.  Past Medical History:  Diagnosis Date  . Hypercholesteremia     Past Surgical History:  Procedure Laterality Date  . No prior surgery         Inpatient Medications: Scheduled Meds: . aspirin EC  325 mg Oral Daily  . heparin  5,000 Units Subcutaneous Q8H  . Influenza vac split quadrivalent PF  0.5 mL Intramuscular Tomorrow-1000  . pravastatin  40 mg Oral q1800   Continuous Infusions:  PRN Meds: acetaminophen, ALPRAZolam, gi cocktail, hydrALAZINE, morphine injection, ondansetron (ZOFRAN) IV  Allergies:   No Known Allergies  Social History:   Social History   Social History  . Marital status: Divorced    Spouse name: N/A  . Number of children: 4  . Years of education: N/A   Occupational History  . manager     Social History Main Topics  . Smoking status: Never Smoker  . Smokeless tobacco: Never Used  . Alcohol use No  . Drug use: No  .  Sexual activity: Not Currently   Other Topics Concern  . Not on file   Social History Narrative  . No narrative on file    Family History:   Family History  Problem Relation Age of Onset  . Sickle cell anemia Father      ROS:  Please see the history of present illness.  ROS  Patient denies fevers, chills, weight loss, hematochezia. All other ROS reviewed and negative.     Physical Exam/Data:   Vitals:   11/20/16 1530 11/20/16 1623 11/20/16 2038 11/21/16 0524  BP: 131/78 (!) 143/79 119/75 132/76  Pulse: 71 66 77 69  Resp: 17 18 18 18   Temp:  98.6 F (37 C) 97.7 F (36.5 C) 98.2 F (36.8 C)  TempSrc:  Oral Oral Oral  SpO2: 100% 100% 100%   Weight:  81.6 kg (180 lb)  81 kg (178 lb 9.2 oz)  Height:  5\' 3"  (1.6 m)      Intake/Output Summary (Last 24 hours) at 11/21/16 0920 Last data filed at 11/21/16 0533  Gross per 24 hour  Intake           423.33 ml  Output              450 ml  Net           -26.67 ml   Filed Weights   11/19/16 2108 11/20/16 1623 11/21/16 0524  Weight: 81.6 kg (180 lb) 81.6 kg (180 lb)  81 kg (178 lb 9.2 oz)   Body mass index is 31.63 kg/m.  General:  Well nourished, well developed, in no acute distress HEENT: normal Lymph: no adenopathy Neck: no JVD Endocrine:  No thryomegaly Vascular: No carotid bruits; FA pulses 2+ bilaterally without bruits  Cardiac:  normal S1, S2; RRR; no murmur  Lungs:  clear to auscultation bilaterally, no wheezing, rhonchi or rales  Abd: soft, nontender, no hepatomegaly  Ext: no edema Musculoskeletal:  No deformities, BUE and BLE strength normal and equal Skin: warm and dry  Neuro:  CNs 2-12 intact, no focal abnormalities noted Psych:  Normal affect   EKG:  The EKG was personally reviewed and demonstrates:  Normal sinus rhythm with nonspecific ST changes. Telemetry:  Telemetry was personally reviewed and demonstrates:  Sinus rhythm   Laboratory Data:  Chemistry Recent Labs Lab 11/19/16 2109  NA 140  K  3.6  CL 105  CO2 27  GLUCOSE 124*  BUN 9  CREATININE 0.81  CALCIUM 9.2  GFRNONAA >60  GFRAA >60  ANIONGAP 8    Hematology Recent Labs Lab 11/19/16 2109  WBC 6.7  RBC 4.30  HGB 12.6  HCT 37.3  MCV 86.7  MCH 29.3  MCHC 33.8  RDW 13.7  PLT 219   Cardiac Enzymes Recent Labs Lab 11/20/16 0736 11/20/16 0937 11/20/16 1302  TROPONINI <0.03 <0.03 <0.03    Recent Labs Lab 11/19/16 2148 11/20/16 0514  TROPIPOC 0.00 0.00     DDimer  Recent Labs Lab 11/20/16 0937  DDIMER 0.56*    Radiology/Studies:  Dg Chest 2 View  Result Date: 11/19/2016 CLINICAL DATA:  Central chest pain and shortness of breath, off and on for 2 months. Worse over the past few days. Weakness. Nonsmoker. EXAM: CHEST  2 VIEW COMPARISON:  None. FINDINGS: The heart size and mediastinal contours are within normal limits. Both lungs are clear. The visualized skeletal structures are unremarkable. IMPRESSION: No active cardiopulmonary disease. Electronically Signed   By: Burman NievesWilliam  Stevens M.D.   On: 11/19/2016 21:46   Ct Angio Chest Pe W Or Wo Contrast  Result Date: 11/20/2016 CLINICAL DATA:  Chest pressure and shortness of Breath EXAM: CT ANGIOGRAPHY CHEST WITH CONTRAST TECHNIQUE: Multidetector CT imaging of the chest was performed using the standard protocol during bolus administration of intravenous contrast. Multiplanar CT image reconstructions and MIPs were obtained to evaluate the vascular anatomy. CONTRAST:  100 mL Isovue 370. COMPARISON:  11/09/2016 FINDINGS: Cardiovascular: Thoracic aorta is within normal limits without aneurysmal dilatation or dissection. No significant calcifications are noted. The heart is at the upper limits of normal in size. The pulmonary artery shows a normal branching pattern. No filling defects to suggest pulmonary emboli are identified. Mediastinum/Nodes: The thoracic inlet is within normal limits. No hilar or mediastinal adenopathy is seen. The esophagus is unremarkable.  Lungs/Pleura: The lungs are well aerated bilaterally. No focal infiltrate, effusion or focal nodule is noted. Upper Abdomen: Visualized upper abdomen is within normal limits. Musculoskeletal: The osseous structures show degenerative change of the thoracic spine. Review of the MIP images confirms the above findings. IMPRESSION: No evidence pulmonary emboli. No acute abnormality noted. Electronically Signed   By: Alcide CleverMark  Lukens M.D.   On: 11/20/2016 14:31    Assessment and Plan:   1 Palpitations-patient sounds to be potentially be having SVT. She has been stable overnight and telemetry unremarkable. She can be discharged from a cardiac standpoint. Would arrange outpatient event monitor. Would also arrange outpatient echocardiogram. Note TSH is normal.  Would add Toprol 25 mg daily. She can follow-up with me in 4-6 weeks. If we find arrhythmia uncontrolled with beta-blockade we could refer for ablation.  2 chest pain-patient only has chest pain during her episodes of palpitations. She otherwise exercises routinely without symptoms. Would arrange echocardiogram. If LV function normal I do not think she requires further ischemia evaluation.  3 elevated d-dimer-chest CT shows no pulmonary embolus.   4 hyperlipidemia-continue statin.   Patient can be discharged from a cardiac standpoint with plan as outlined above. Please call with questions.     For questions or updates, please contact [facility] Please consult [email or web address] for contact info under Cardiology/STEMI.   Signed, Olga Millers, MD  11/21/2016 9:20 AM

## 2016-11-21 NOTE — Progress Notes (Signed)
  Echocardiogram 2D Echocardiogram has been performed.  [name], Tony 11/21/2016, 3:25 PM

## 2016-11-21 NOTE — Discharge Instructions (Signed)

## 2016-11-21 NOTE — Discharge Summary (Signed)
Physician Discharge Summary  Leah Harvey ZOX:[id number] DOB: 1960-03-30 DOA: 11/20/2016  PCP: Laurann MontanaWhite, Cynthia, MD  Admit date: 11/20/2016 Discharge date: 11/21/2016  Time spent: 45 minutes  Recommendations for Outpatient Follow-up:  -To be discharged home today. -Dr. Jens Somrenshaw would like to follow-up in his office in 4 weeks, he will call patient and arrange for 30 day event monitor.   Discharge Diagnoses:  Active Problems:   Chest pain, rule out acute myocardial infarction   Hyperlipidemia   Elevated fasting glucose   Hypertension   Discharge Condition: stable and improved  Filed Weights   11/19/16 2108 11/20/16 1623 11/21/16 0524  Weight: 81.6 kg (180 lb) 81.6 kg (180 lb) 81 kg (178 lb 9.2 oz)    History of present illness:  As per Dr. Susie CassetteAbrol on 10/19:  Leah Harvey is a 56 y.o. female with medical history significant for HLD, presenting with 2 month history of "pressure feeling in the mid chest ", a combining by intermittent palpitations, lasting about a few seconds to 1 minute, but has been happening more frequently. She feels at times the heart is racing or skipping heart beat, taking a deep breath to relieve this symptom. She denies any dyspnea, nausea, diaphoresis, or vomiting.This discomfort is not worse with deep inspiration. The symptoms are not clearly exertional. She reports it can happen when sleeping, or seeting at the desk. She does not take aspirin. Denies any history of tobacco, alcohol, recreational drugs,caffeine, hypertension, or diabetes. She denies any family history of heart disease. She has not been seen by her primary physician regarding this issues, and has never had a cardiology evaluation, or catheterization.  Denies any dizziness or falls. No syncope or presyncope.  Denies any fever or chills. Denies abdominal pain. Appetite is normal andeats salt rich foods. Denies history of GERD. Denies any leg swelling or calf pain. Denies any headaches or vision  changes. No recent long distance trips. Denies any new stressors. No new meds, hormonal therapy or herbal supplements. She reports exercising daily.  Hospital Course:   Chest pain/palpitations -She has ruled out for ACS, telemetry has been unremarkable. -Has been evaluated by cardiology, Dr. Jens Somrenshaw, who believes that episodes of palpitations may likely be SVT. He has recommended 30 day event monitor and addition of Toprol-XL 25 mg daily and will like to follow-up with her in the office in 4 weeks. -2-D echo shows ejection fraction of 55-60% with normal wall motion and grade 1 diastolic dysfunction. -TSH is within normal limits. -CT is negative for PE.  Hyperlipidemia -Continue statin  Procedures:  Echo with results as above   Consultations:  cardiology  Discharge Instructions  Discharge Instructions    Diet - low sodium heart healthy    Complete by:  As directed    Increase activity slowly    Complete by:  As directed      Allergies as of 11/21/2016   No Known Allergies     Medication List    STOP taking these medications   oxyCODONE-acetaminophen 5-325 MG tablet Commonly known as:  PERCOCET   promethazine 25 MG suppository Commonly known as:  PHENERGAN     TAKE these medications   FISH OIL PO Take 1 tablet by mouth daily.   metoprolol succinate 25 MG 24 hr tablet Commonly known as:  TOPROL-XL Take 1 tablet (25 mg total) by mouth daily.   multivitamin with minerals Tabs tablet Take 1 tablet by mouth daily.   pravastatin 40 MG tablet Commonly known  as:  PRAVACHOL Take 40 mg by mouth daily.   VITAMIN D PO Take 1 tablet by mouth daily.      No Known Allergies Follow-up Information    Lewayne Bunting, MD. Schedule an appointment as soon as possible for a visit in 4 week(s).   Specialty:  Cardiology Why:  Office will call you to set up a monitor to evaluate your heart rate and rhythm. Contact information: [address] [place]  [address] [phone]            The results of significant diagnostics from this hospitalization (including imaging, microbiology, ancillary and laboratory) are listed below for reference.    Significant Diagnostic Studies: Dg Chest 2 View  Result Date: 11/19/2016 CLINICAL DATA:  Central chest pain and shortness of breath, off and on for 2 months. Worse over the past few days. Weakness. Nonsmoker. EXAM: CHEST  2 VIEW COMPARISON:  None. FINDINGS: The heart size and mediastinal contours are within normal limits. Both lungs are clear. The visualized skeletal structures are unremarkable. IMPRESSION: No active cardiopulmonary disease. Electronically Signed   By: Burman Nieves M.D.   On: 11/19/2016 21:46   Ct Angio Chest Pe W Or Wo Contrast  Result Date: 11/20/2016 CLINICAL DATA:  Chest pressure and shortness of Breath EXAM: CT ANGIOGRAPHY CHEST WITH CONTRAST TECHNIQUE: Multidetector CT imaging of the chest was performed using the standard protocol during bolus administration of intravenous contrast. Multiplanar CT image reconstructions and MIPs were obtained to evaluate the vascular anatomy. CONTRAST:  100 mL Isovue 370. COMPARISON:  11/09/2016 FINDINGS: Cardiovascular: Thoracic aorta is within normal limits without aneurysmal dilatation or dissection. No significant calcifications are noted. The heart is at the upper limits of normal in size. The pulmonary artery shows a normal branching pattern. No filling defects to suggest pulmonary emboli are identified. Mediastinum/Nodes: The thoracic inlet is within normal limits. No hilar or mediastinal adenopathy is seen. The esophagus is unremarkable. Lungs/Pleura: The lungs are well aerated bilaterally. No focal infiltrate, effusion or focal nodule is noted. Upper Abdomen: Visualized upper abdomen is within normal limits. Musculoskeletal: The osseous structures show degenerative change of the thoracic spine. Review of the MIP images confirms the above  findings. IMPRESSION: No evidence pulmonary emboli. No acute abnormality noted. Electronically Signed   By: Alcide Clever M.D.   On: 11/20/2016 14:31    Microbiology: No results found for this or any previous visit (from the past 240 hour(s)).   Labs: Basic Metabolic Panel:  Recent Labs Lab 11/19/16 2109  NA 140  K 3.6  CL 105  CO2 27  GLUCOSE 124*  BUN 9  CREATININE 0.81  CALCIUM 9.2   Liver Function Tests: No results for input(s): AST, ALT, ALKPHOS, BILITOT, PROT, ALBUMIN in the last 168 hours. No results for input(s): LIPASE, AMYLASE in the last 168 hours. No results for input(s): AMMONIA in the last 168 hours. CBC:  Recent Labs Lab 11/19/16 2109  WBC 6.7  HGB 12.6  HCT 37.3  MCV 86.7  PLT 219   Cardiac Enzymes:  Recent Labs Lab 11/20/16 0736 11/20/16 0937 11/20/16 1302  TROPONINI <0.03 <0.03 <0.03   BNP: BNP (last 3 results) No results for input(s): BNP in the last 8760 hours.  ProBNP (last 3 results) No results for input(s): PROBNP in the last 8760 hours.  CBG:  Recent Labs Lab 11/20/16 1800 11/21/16 0754 11/21/16 1210 11/21/16 1616  GLUCAP 96 90 79 78       Signed:  Chaya Jan  [facility] Pager: [phone] 11/21/2016, 4:36 PM

## 2016-11-25 ENCOUNTER — Other Ambulatory Visit: Payer: Self-pay | Admitting: Cardiology

## 2016-11-25 DIAGNOSIS — R002 Palpitations: Secondary | ICD-10-CM

## 2016-11-25 DIAGNOSIS — Z23 Encounter for immunization: Secondary | ICD-10-CM | POA: Diagnosis not present

## 2016-11-25 DIAGNOSIS — R1900 Intra-abdominal and pelvic swelling, mass and lump, unspecified site: Secondary | ICD-10-CM | POA: Diagnosis not present

## 2016-11-25 DIAGNOSIS — Z1211 Encounter for screening for malignant neoplasm of colon: Secondary | ICD-10-CM | POA: Diagnosis not present

## 2016-11-25 DIAGNOSIS — E785 Hyperlipidemia, unspecified: Secondary | ICD-10-CM | POA: Diagnosis not present

## 2016-11-25 DIAGNOSIS — E559 Vitamin D deficiency, unspecified: Secondary | ICD-10-CM | POA: Diagnosis not present

## 2016-11-25 DIAGNOSIS — Z0000 Encounter for general adult medical examination without abnormal findings: Secondary | ICD-10-CM | POA: Diagnosis not present

## 2016-12-01 ENCOUNTER — Other Ambulatory Visit: Payer: Self-pay | Admitting: Family Medicine

## 2016-12-01 DIAGNOSIS — R1900 Intra-abdominal and pelvic swelling, mass and lump, unspecified site: Secondary | ICD-10-CM

## 2016-12-08 ENCOUNTER — Ambulatory Visit
Admission: RE | Admit: 2016-12-08 | Discharge: 2016-12-08 | Disposition: A | Discharge status: Home / Self Care | Payer: BLUE CROSS/BLUE SHIELD | Source: Ambulatory Visit | Attending: Family Medicine | Admitting: Family Medicine

## 2016-12-08 DIAGNOSIS — D259 Leiomyoma of uterus, unspecified: Secondary | ICD-10-CM | POA: Diagnosis not present

## 2016-12-08 DIAGNOSIS — R1900 Intra-abdominal and pelvic swelling, mass and lump, unspecified site: Secondary | ICD-10-CM

## 2016-12-08 MED ORDER — IOPAMIDOL (ISOVUE-300) INJECTION 61%
100.0000 mL | Freq: Once | INTRAVENOUS | Status: AC | PRN
  Administered 2016-12-08: 100 mL via INTRAVENOUS

## 2016-12-10 ENCOUNTER — Ambulatory Visit: Payer: BLUE CROSS/BLUE SHIELD | Admitting: Physician Assistant

## 2016-12-10 ENCOUNTER — Encounter: Payer: Self-pay | Admitting: Physician Assistant

## 2016-12-10 VITALS — BP 143/82 | HR 66 | Ht 64.0 in | Wt 176.4 lb

## 2016-12-10 DIAGNOSIS — R079 Chest pain, unspecified: Secondary | ICD-10-CM

## 2016-12-10 DIAGNOSIS — E782 Mixed hyperlipidemia: Secondary | ICD-10-CM

## 2016-12-10 DIAGNOSIS — R002 Palpitations: Secondary | ICD-10-CM | POA: Diagnosis not present

## 2016-12-10 MED ORDER — PRAVASTATIN SODIUM 80 MG PO TABS: 80.0000 mg | ORAL_TABLET | Freq: Every day | ORAL | 3 refills | Status: DC

## 2016-12-10 NOTE — Patient Instructions (Addendum)
Medication Instructions:  INCREASE Pravachol(Pravastatin) to 80 mg once a day--ok to take 2(two) 40 mg tablets together to finish what you have at him   Labwork: Your physician recommends that you return for lab work in: FASTING-LIPIDS, LFT in 6-8 WEEKS--ON SAME DAY AS APPOINTMENT WITH DR Jens SomRENSHAW   Testing/Procedures: Your physician has recommended that you wear an 30 Day event monitor. Event monitors are medical devices that record the heart's electrical activity. Doctors most often us these monitors to diagnose arrhythmias. Arrhythmias are problems with the speed or rhythm of the heartbeat. The monitor is a small, portable device. You can wear one while you do your normal daily activities. This is usually used to diagnose what is causing palpitations/syncope (passing out).  HOLD METOPROLOL FOR THE FIRST 7 DAYS YOU WEAR THE EVENT MONITOR   This monitor will be placed on at the [address] location.   Follow-Up: Your physician recommends that you schedule a follow-up appointment in: 2 months with DR CRENSHAW  Any Other Special Instructions Will Be Listed Below (If Applicable).  If you need a refill on your cardiac medications before your next appointment, please call your pharmacy.

## 2016-12-10 NOTE — Progress Notes (Signed)
Cardiology Office Note    Date:  12/10/2016   ID:  Leah Harvey, DOB March 22, 1960, MRN [id number]  PCP:  Laurann MontanaWhite, Cynthia, MD  Cardiologist:  Dr. Jens Somrenshaw  Chief Complaint  Patient presents with  . Follow-up    seen for Dr. Jens Somrenshaw    History of Present Illness:  Leah Harvey is a 56 y.o. female with no prior cardiac history who was recently presented to the hospital on 11/21/2016 for evaluation of chest pain and palpitation. She originally came from [place]. For the past 2 months, she noted episodes of sudden onset of palpitation followed by chest pain. Symptoms last from seconds to 1 minute. They spontaneously resolve. Cardiology was asked to evaluate. There was some concern that patient may be having episodic SVT. 2-D echo obtained during this admission showed EF 55-60%, normal wall motion and grade 1 DD. CTA of chest was negative for PE. Outpatient event monitor was recommended. TSH was normal. She was discharged on Toprol-XL 25 mg daily. She also had abdominal CT on 12/08/2016 which did not reveal any acute process for abdominal distention and left upper quadrant pain, there were uterine fibroid. 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  Patient presents today for cardiology office visit. She denies any further chest discomfort, she continued to have occasional palpitation but only last a second. It is possible she is having some PVCs. I have ordered the 30 day event monitor. She wished to hold the metoprolol for 7 days while wearing the event monitor, I think this is very reasonable to see if she does have recurrent SVT. Otherwise recent lipid panel also shows she has uncontrolled hyperlipidemia, I will increase her Pravachol to 80 mg daily. She will need a 6-8 weeks fasting lipid panel and LFTs.   Past Medical History:  Diagnosis Date  . Hypercholesteremia     Past Surgical History:  Procedure Laterality Date  .  No prior surgery      Current Medications: Outpatient Medications Prior to Visit  Medication Sig Dispense Refill  . Cholecalciferol (VITAMIN D PO) Take 1 tablet by mouth daily.    . metoprolol succinate (TOPROL-XL) 25 MG 24 hr tablet Take 1 tablet (25 mg total) by mouth daily. 30 tablet 2  . Multiple Vitamin (MULTIVITAMIN WITH MINERALS) TABS tablet Take 1 tablet by mouth daily.    . Omega-3 Fatty Acids (FISH OIL PO) Take 1 tablet by mouth daily.    . pravastatin (PRAVACHOL) 40 MG tablet Take 40 mg by mouth daily.     No facility-administered medications prior to visit.      Allergies:   Patient has no known allergies.   Social History   Socioeconomic History  . Marital status: Divorced    Spouse name: None  . Number of children: 4  . Years of education: None  . Highest education level: None  Social Needs  . Financial resource strain: None  . Food insecurity - worry: None  . Food insecurity - inability: None  . Transportation needs - medical: None  . Transportation needs - non-medical: None  Occupational History  . Occupation: Production designer, theatre/television/filmmanager   Tobacco Use  . Smoking status: Never Smoker  . Smokeless tobacco: Never Used  Substance and Sexual Activity  . Alcohol use: No  . Drug use: No  . Sexual activity: Not Currently  Other Topics Concern  . None  Social History Narrative  . None  Family History:  The patient's family history includes Sickle cell anemia in her father.   ROS:   Please see the history of present illness.    ROS All other systems reviewed and are negative.   PHYSICAL EXAM:   VS:  BP (!) 143/82   Pulse 66   Ht 5\' 4"  (1.626 m)   Wt 176 lb 6.4 oz (80 kg)   BMI 30.28 kg/m    GEN: Well nourished, well developed, in no acute distress  HEENT: normal  Neck: no JVD, carotid bruits, or masses Cardiac: RRR; no murmurs, rubs, or gallops,no edema  Respiratory:  clear to auscultation bilaterally, normal work of breathing GI: soft, nontender, nondistended, +  BS MS: no deformity or atrophy  Skin: warm and dry, no rash Neuro:  Alert and Oriented x 3, Strength and sensation are intact Psych: euthymic mood, full affect  Wt Readings from Last 3 Encounters:  12/10/16 176 lb 6.4 oz (80 kg)  11/21/16 178 lb 9.2 oz (81 kg)  10/27/11 162 lb (73.5 kg)      Studies/Labs Reviewed:   EKG:  EKG is not ordered today.    Recent Labs: 11/19/2016: BUN 9; Creatinine, Ser 0.81; Hemoglobin 12.6; Platelets 219; Potassium 3.6; Sodium 140 11/20/2016: TSH 0.965   Lipid Panel    Component Value Date/Time   CHOL 196 11/20/2016 0736   TRIG 30 11/20/2016 0736   HDL 58 11/20/2016 0736   CHOLHDL 3.4 11/20/2016 0736   VLDL 6 11/20/2016 0736   LDLCALC 132 (H) 11/20/2016 0736    Additional studies/ records that were reviewed today include:    CTA of chest 11/20/2016 IMPRESSION: No evidence pulmonary emboli.  No acute abnormality noted.    Echo 11/21/2016 LV EF: 55% -   60%  Study Conclusions  - Left ventricle: The cavity size was normal. Wall thickness was   normal. Systolic function was normal. The estimated ejection   fraction was in the range of 55% to 60%. Wall motion was normal;   there were no regional wall motion abnormalities. Doppler   parameters are consistent with abnormal left ventricular   relaxation (grade 1 diastolic dysfunction).  Impressions:  - Normal LV systolic function; mild diastolic dysfunction.    CT abdomen and pelvis 12/08/2016 IMPRESSION: 1. No acute process or explanation for abdominal distension/left upper quadrant pain. 2. Possible bladder wall irregularity which could be partially due to underdistention. Consider correlation with urinalysis to exclude cystitis. 3. Uterine fibroid. 4. 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 Present back to 2013. 5.  Aortic Atherosclerosis (ICD10-I70.0).   ASSESSMENT:    1. Palpitations   2. Mixed hyperlipidemia   3. Chest pain,  unspecified type      PLAN:  In order of problems listed above:  1. Palpitation: symptom improved after taking Toprol-XL. However continued to have occasional skipped heartbeat. I will order a 30 day event monitor.  2. Chest pain: symptom only occurs during palpitation. This has resolved after starting Toprol-XL.  3. Hyperlipidemia: LDL elevated during the hospitalization, will increase Pravachol to 80 mg daily. Obtain fasting lipid panel and LFT in 6-8 weeks    Medication Adjustments/Labs and Tests Ordered: Current medicines are reviewed at length with the patient today.  Concerns regarding medicines are outlined above.  Medication changes, Labs and Tests ordered today are listed in the Patient Instructions below. Patient Instructions  Medication Instructions:  INCREASE Pravachol(Pravastatin) to 80 mg once a day--ok to take 2(two) 40 mg  tablets together to finish what you have at him   Labwork: Your physician recommends that you return for lab work in: FASTING-LIPIDS, LFT in 6-8 WEEKS--ON SAME DAY AS APPOINTMENT WITH DR Jens SomRENSHAW   Testing/Procedures: Your physician has recommended that you wear an 30 Day event monitor. Event monitors are medical devices that record the heart's electrical activity. Doctors most often us these monitors to diagnose arrhythmias. Arrhythmias are problems with the speed or rhythm of the heartbeat. The monitor is a small, portable device. You can wear one while you do your normal daily activities. This is usually used to diagnose what is causing palpitations/syncope (passing out).  HOLD METOPROLOL FOR THE FIRST 7 DAYS YOU WEAR THE EVENT MONITOR   This monitor will be placed on at the [address] location.   Follow-Up: Your physician recommends that you schedule a follow-up appointment in: 2 months with DR CRENSHAW  Any Other Special Instructions Will Be Listed Below (If Applicable).  If you need a refill on your cardiac medications before your next  appointment, please call your pharmacy.     Ramond DialSigned, Esten Dollar, [place]  12/10/2016 8:23 PM    [facility] [address], [place], [place]  [address] Phone: [phone]; Fax: [phone]

## 2016-12-15 DIAGNOSIS — R740 Nonspecific elevation of levels of transaminase and lactic acid dehydrogenase [LDH]: Secondary | ICD-10-CM | POA: Diagnosis not present

## 2016-12-22 ENCOUNTER — Ambulatory Visit
Admission: RE | Admit: 2016-12-22 | Discharge: 2016-12-22 | Disposition: A | Discharge status: Home / Self Care | Payer: BLUE CROSS/BLUE SHIELD | Source: Ambulatory Visit | Attending: Family Medicine | Admitting: Family Medicine

## 2016-12-22 ENCOUNTER — Other Ambulatory Visit: Payer: Self-pay | Admitting: Family Medicine

## 2016-12-22 DIAGNOSIS — M189 Osteoarthritis of first carpometacarpal joint, unspecified: Secondary | ICD-10-CM | POA: Diagnosis not present

## 2016-12-22 DIAGNOSIS — M79641 Pain in right hand: Secondary | ICD-10-CM

## 2016-12-22 DIAGNOSIS — M79642 Pain in left hand: Secondary | ICD-10-CM

## 2016-12-23 ENCOUNTER — Ambulatory Visit (INDEPENDENT_AMBULATORY_CARE_PROVIDER_SITE_OTHER): Payer: BLUE CROSS/BLUE SHIELD

## 2016-12-23 DIAGNOSIS — R002 Palpitations: Secondary | ICD-10-CM | POA: Diagnosis not present

## 2017-01-25 ENCOUNTER — Other Ambulatory Visit: Payer: Self-pay | Admitting: *Deleted

## 2017-01-25 MED ORDER — METOPROLOL SUCCINATE ER 25 MG PO TB24: 25.0000 mg | ORAL_TABLET | Freq: Every day | ORAL | 3 refills | Status: DC

## 2017-02-20 DIAGNOSIS — Z1231 Encounter for screening mammogram for malignant neoplasm of breast: Secondary | ICD-10-CM | POA: Diagnosis not present

## 2017-03-01 ENCOUNTER — Other Ambulatory Visit (HOSPITAL_COMMUNITY)
Admission: RE | Admit: 2017-03-01 | Discharge: 2017-03-01 | Disposition: A | Discharge status: Home / Self Care | Payer: BLUE CROSS/BLUE SHIELD | Source: Ambulatory Visit | Attending: Obstetrics and Gynecology | Admitting: Obstetrics and Gynecology

## 2017-03-01 ENCOUNTER — Other Ambulatory Visit: Payer: Self-pay | Admitting: Obstetrics and Gynecology

## 2017-03-01 DIAGNOSIS — Z01419 Encounter for gynecological examination (general) (routine) without abnormal findings: Secondary | ICD-10-CM | POA: Insufficient documentation

## 2017-03-01 NOTE — Progress Notes (Deleted)
      HPI: FU palpitations; initially seen 11/18 with chest pain and palpitations by Azalee CourseHao Meng. CTA 10/18 showed no pulmonary embolus. Echo 10/18 showed normal LV function; grade 1 DD. Monitor 11/18 showed sinus with pacs, brief PAT and PVCs. TSH 10/18 normal. Toprol added. Since last seen,   Current Outpatient Medications  Medication Sig Dispense Refill  . Cholecalciferol (VITAMIN D PO) Take 1 tablet by mouth daily.    . metoprolol succinate (TOPROL-XL) 25 MG 24 hr tablet Take 1 tablet (25 mg total) by mouth daily. 90 tablet 3  . Multiple Vitamin (MULTIVITAMIN WITH MINERALS) TABS tablet Take 1 tablet by mouth daily.    . Omega-3 Fatty Acids (FISH OIL PO) Take 1 tablet by mouth daily.    . pravastatin (PRAVACHOL) 80 MG tablet Take 1 tablet (80 mg total) daily by mouth. 30 tablet 3   No current facility-administered medications for this visit.      Past Medical History:  Diagnosis Date  . Hypercholesteremia     Past Surgical History:  Procedure Laterality Date  . No prior surgery      Social History   Socioeconomic History  . Marital status: Divorced    Spouse name: Not on file  . Number of children: 4  . Years of education: Not on file  . Highest education level: Not on file  Social Needs  . Financial resource strain: Not on file  . Food insecurity - worry: Not on file  . Food insecurity - inability: Not on file  . Transportation needs - medical: Not on file  . Transportation needs - non-medical: Not on file  Occupational History  . Occupation: Production designer, theatre/television/filmmanager   Tobacco Use  . Smoking status: Never Smoker  . Smokeless tobacco: Never Used  Substance and Sexual Activity  . Alcohol use: No  . Drug use: No  . Sexual activity: Not Currently  Other Topics Concern  . Not on file  Social History Narrative  . Not on file    Family History  Problem Relation Age of Onset  . Sickle cell anemia Father     ROS: no fevers or chills, productive cough, hemoptysis, dysphasia,  odynophagia, melena, hematochezia, dysuria, hematuria, rash, seizure activity, orthopnea, PND, pedal edema, claudication. Remaining systems are negative.  Physical Exam: Well-developed well-nourished in no acute distress.  Skin is warm and dry.  HEENT is normal.  Neck is supple.  Chest is clear to auscultation with normal expansion.  Cardiovascular exam is regular rate and rhythm.  Abdominal exam nontender or distended. No masses palpated. Extremities show no edema. neuro grossly intact  ECG- personally reviewed  A/P  1 palpitations-as outlined monitor shows PACs, PVCs and brief PAT.  They have improved with Toprol.  We will continue.  2 chest pain-previous symptoms atypical.  They have resolved.  No further evaluation.  3 hyperlipidemia-continue statin.  Check lipids and liver.  Olga MillersBrian Crenshaw, MD

## 2017-03-02 LAB — CYTOLOGY - PAP
DIAGNOSIS: NEGATIVE
HPV (WINDOPATH): NOT DETECTED

## 2017-03-09 ENCOUNTER — Ambulatory Visit: Payer: BLUE CROSS/BLUE SHIELD | Admitting: Cardiology

## 2017-04-09 ENCOUNTER — Other Ambulatory Visit: Payer: Self-pay | Admitting: Physician Assistant

## 2017-04-09 DIAGNOSIS — E782 Mixed hyperlipidemia: Secondary | ICD-10-CM

## 2017-04-09 DIAGNOSIS — R002 Palpitations: Secondary | ICD-10-CM

## 2017-04-09 NOTE — Telephone Encounter (Signed)
REFIL 

## 2017-04-09 NOTE — Telephone Encounter (Signed)
Please review for refill, Thanks !  

## 2017-05-24 NOTE — Progress Notes (Signed)
HPI: FU CP and palpitations.  Patient originally seen in October 2018 with chest pain and palpitations.  Note she is from [place].  Chest CT October 2018 showed no pulmonary embolus.  Echocardiogram October 2018 showed normal LV function.  Monitor November 2018 showed sinus rhythm with PACs, brief PAT and PVCs.  Toprol added. Since last seen, patient occasionally has brief palpitations not sustained.  She denies dyspnea or syncope.  She has occasional chest heaviness with exercise but also at rest.  Short lived.  Current Outpatient Medications  Medication Sig Dispense Refill  . Cholecalciferol (VITAMIN D PO) Take 1 tablet by mouth daily.    . metoprolol succinate (TOPROL-XL) 25 MG 24 hr tablet Take 1 tablet (25 mg total) by mouth daily. 90 tablet 3  . Multiple Vitamin (MULTIVITAMIN WITH MINERALS) TABS tablet Take 1 tablet by mouth daily.    . Omega-3 Fatty Acids (FISH OIL PO) Take 1 tablet by mouth daily.    . pravastatin (PRAVACHOL) 80 MG tablet TAKE 1 TABLET (80 MG TOTAL) DAILY BY MOUTH. 30 tablet 5   No current facility-administered medications for this visit.      Past Medical History:  Diagnosis Date  . Hypercholesteremia     Past Surgical History:  Procedure Laterality Date  . No prior surgery      Social History   Socioeconomic History  . Marital status: Divorced    Spouse name: Not on file  . Number of children: 4  . Years of education: Not on file  . Highest education level: Not on file  Occupational History  . Occupation: Production designer, theatre/television/film   Social Needs  . Financial resource strain: Not on file  . Food insecurity:    Worry: Not on file    Inability: Not on file  . Transportation needs:    Medical: Not on file    Non-medical: Not on file  Tobacco Use  . Smoking status: Never Smoker  . Smokeless tobacco: Never Used  Substance and Sexual Activity  . Alcohol use: No  . Drug use: No  . Sexual activity: Not Currently  Lifestyle  . Physical activity:    Days per  week: Not on file    Minutes per session: Not on file  . Stress: Not on file  Relationships  . Social connections:    Talks on phone: Not on file    Gets together: Not on file    Attends religious service: Not on file    Active member of club or organization: Not on file    Attends meetings of clubs or organizations: Not on file    Relationship status: Not on file  . Intimate partner violence:    Fear of current or ex partner: Not on file    Emotionally abused: Not on file    Physically abused: Not on file    Forced sexual activity: Not on file  Other Topics Concern  . Not on file  Social History Narrative  . Not on file    Family History  Problem Relation Age of Onset  . Sickle cell anemia Father     ROS: no fevers or chills, productive cough, hemoptysis, dysphasia, odynophagia, melena, hematochezia, dysuria, hematuria, rash, seizure activity, orthopnea, PND, pedal edema, claudication. Remaining systems are negative.  Physical Exam: Well-developed well-nourished in no acute distress.  Skin is warm and dry.  HEENT is normal.  Neck is supple.  Chest is clear to auscultation with normal expansion.  Cardiovascular  exam is regular rate and rhythm.  Abdominal exam nontender or distended. No masses palpated. Extremities show no edema. neuro grossly intact  Electrocardiogram shows sinus rhythm with anterior and inferior T wave inversion. A/P  1 palpitations-monitor showed PACs, PVCs and brief PAT.  Symptoms reasonably well controlled.  Continued Toprol.  2 chest pain-patient has occasional chest pain that is somewhat atypical.  Her baseline electrocardiogram is abnormal.  Plan stress echocardiogram for risk stratification.  3 hyperlipidemia-continue statin.  Check lipids and liver.  Olga MillersBrian Crenshaw, MD

## 2017-05-31 ENCOUNTER — Encounter: Payer: Self-pay | Admitting: Cardiology

## 2017-05-31 ENCOUNTER — Ambulatory Visit: Payer: BLUE CROSS/BLUE SHIELD | Admitting: Cardiology

## 2017-05-31 VITALS — BP 120/82 | HR 70 | Ht 63.0 in | Wt 176.8 lb

## 2017-05-31 DIAGNOSIS — R002 Palpitations: Secondary | ICD-10-CM | POA: Diagnosis not present

## 2017-05-31 DIAGNOSIS — E782 Mixed hyperlipidemia: Secondary | ICD-10-CM | POA: Diagnosis not present

## 2017-05-31 DIAGNOSIS — R072 Precordial pain: Secondary | ICD-10-CM | POA: Diagnosis not present

## 2017-05-31 LAB — HEPATIC FUNCTION PANEL
ALK PHOS: 65 IU/L (ref 39–117)
ALT: 16 IU/L (ref 0–32)
AST: 19 IU/L (ref 0–40)
Albumin: 4.2 g/dL (ref 3.5–5.5)
BILIRUBIN, DIRECT: 0.1 mg/dL (ref 0.00–0.40)
Bilirubin Total: 0.3 mg/dL (ref 0.0–1.2)
Total Protein: 6.4 g/dL (ref 6.0–8.5)

## 2017-05-31 LAB — LIPID PANEL
Chol/HDL Ratio: 2.7 ratio (ref 0.0–4.4)
Cholesterol, Total: 183 mg/dL (ref 100–199)
HDL: 67 mg/dL (ref >39)
LDL Calculated: 107 mg/dL — ABNORMAL HIGH (ref 0–99)
TRIGLYCERIDES: 46 mg/dL (ref 0–149)
VLDL Cholesterol Cal: 9 mg/dL (ref 5–40)

## 2017-05-31 MED ORDER — METOPROLOL SUCCINATE ER 25 MG PO TB24: 25.0000 mg | ORAL_TABLET | Freq: Every day | ORAL | 3 refills | Status: DC

## 2017-05-31 MED ORDER — PRAVASTATIN SODIUM 80 MG PO TABS
80.0000 mg | ORAL_TABLET | Freq: Every day | ORAL | 3 refills | Status: AC
Start: 2017-05-31 — End: ?

## 2017-05-31 NOTE — Patient Instructions (Signed)
Medication Instructions:   Refill sent to the pharmacy electronically.   Labwork:  Your physician recommends that you HAVE LAB WORK TODAY  Testing/Procedures:  Your physician has requested that you have a stress echocardiogram. For further information please visit [email or web address] Please follow instruction sheet as given.DO NOT TAKE METOPROLOL THE DAY BEFORE OR THE DAY OF TESTING    Follow-Up:  Your physician wants you to follow-up in: ONE YEAR WITH DR Shelda Pal will receive a reminder letter in the mail two months in advance. If you don't receive a letter, please call our office to schedule the follow-up appointment.   If you need a refill on your cardiac medications before your next appointment, please call your pharmacy.

## 2017-06-01 ENCOUNTER — Encounter: Payer: Self-pay | Admitting: *Deleted

## 2017-06-03 ENCOUNTER — Telehealth (HOSPITAL_COMMUNITY): Payer: Self-pay | Admitting: *Deleted

## 2017-06-03 NOTE — Telephone Encounter (Signed)
Patient given detailed instructions per Stress Test Requisition Sheet for test on 06/08/17 at 2:30.Patient Notified to arrive 30 minutes early, and that it is imperative to arrive on time for appointment to keep from having the test rescheduled.  Patient verbalized understanding. Daneil Dolin

## 2017-06-08 ENCOUNTER — Other Ambulatory Visit (HOSPITAL_COMMUNITY): Payer: BLUE CROSS/BLUE SHIELD

## 2017-06-08 ENCOUNTER — Ambulatory Visit (HOSPITAL_COMMUNITY): Payer: BLUE CROSS/BLUE SHIELD

## 2017-06-08 ENCOUNTER — Ambulatory Visit (HOSPITAL_COMMUNITY): Payer: BLUE CROSS/BLUE SHIELD | Attending: Cardiovascular Disease

## 2017-06-08 DIAGNOSIS — R072 Precordial pain: Secondary | ICD-10-CM

## 2017-06-24 ENCOUNTER — Ambulatory Visit: Payer: BLUE CROSS/BLUE SHIELD | Admitting: Cardiology

## 2017-10-14 DIAGNOSIS — H40023 Open angle with borderline findings, high risk, bilateral: Secondary | ICD-10-CM | POA: Diagnosis not present

## 2017-11-17 DIAGNOSIS — S139XXA Sprain of joints and ligaments of unspecified parts of neck, initial encounter: Secondary | ICD-10-CM | POA: Diagnosis not present

## 2017-11-26 DIAGNOSIS — Z1159 Encounter for screening for other viral diseases: Secondary | ICD-10-CM | POA: Diagnosis not present

## 2017-11-26 DIAGNOSIS — Z1211 Encounter for screening for malignant neoplasm of colon: Secondary | ICD-10-CM | POA: Diagnosis not present

## 2017-11-26 DIAGNOSIS — E559 Vitamin D deficiency, unspecified: Secondary | ICD-10-CM | POA: Diagnosis not present

## 2017-11-26 DIAGNOSIS — Z23 Encounter for immunization: Secondary | ICD-10-CM | POA: Diagnosis not present

## 2017-11-26 DIAGNOSIS — E785 Hyperlipidemia, unspecified: Secondary | ICD-10-CM | POA: Diagnosis not present

## 2017-11-26 DIAGNOSIS — Z0000 Encounter for general adult medical examination without abnormal findings: Secondary | ICD-10-CM | POA: Diagnosis not present

## 2018-02-24 DIAGNOSIS — Z1231 Encounter for screening mammogram for malignant neoplasm of breast: Secondary | ICD-10-CM | POA: Diagnosis not present

## 2018-03-02 DIAGNOSIS — Z01419 Encounter for gynecological examination (general) (routine) without abnormal findings: Secondary | ICD-10-CM | POA: Diagnosis not present

## 2018-04-24 DIAGNOSIS — B349 Viral infection, unspecified: Secondary | ICD-10-CM | POA: Diagnosis not present

## 2018-04-24 DIAGNOSIS — R509 Fever, unspecified: Secondary | ICD-10-CM | POA: Diagnosis not present

## 2018-04-24 DIAGNOSIS — Z20828 Contact with and (suspected) exposure to other viral communicable diseases: Secondary | ICD-10-CM | POA: Diagnosis not present

## 2018-04-24 DIAGNOSIS — J209 Acute bronchitis, unspecified: Secondary | ICD-10-CM | POA: Diagnosis not present

## 2018-04-30 ENCOUNTER — Emergency Department (HOSPITAL_COMMUNITY): Payer: BLUE CROSS/BLUE SHIELD

## 2018-04-30 ENCOUNTER — Encounter (HOSPITAL_COMMUNITY): Payer: Self-pay

## 2018-04-30 ENCOUNTER — Emergency Department (HOSPITAL_COMMUNITY)
Admission: EM | Admit: 2018-04-30 | Discharge: 2018-04-30 | Disposition: A | Discharge status: Home / Self Care | Payer: BLUE CROSS/BLUE SHIELD | Attending: Emergency Medicine | Admitting: Emergency Medicine

## 2018-04-30 ENCOUNTER — Other Ambulatory Visit: Payer: Self-pay

## 2018-04-30 ENCOUNTER — Encounter (HOSPITAL_COMMUNITY): Payer: Self-pay | Admitting: Emergency Medicine

## 2018-04-30 ENCOUNTER — Ambulatory Visit (HOSPITAL_COMMUNITY)
Admission: EM | Admit: 2018-04-30 | Discharge: 2018-04-30 | Disposition: A | Discharge status: Home / Self Care | Payer: BLUE CROSS/BLUE SHIELD | Source: Home / Self Care | Attending: Family Medicine | Admitting: Family Medicine

## 2018-04-30 ENCOUNTER — Emergency Department (HOSPITAL_COMMUNITY)
Admission: EM | Admit: 2018-04-30 | Discharge: 2018-04-30 | Disposition: A | Discharge status: Home / Self Care | Payer: BLUE CROSS/BLUE SHIELD | Source: Home / Self Care | Attending: Emergency Medicine | Admitting: Emergency Medicine

## 2018-04-30 DIAGNOSIS — I10 Essential (primary) hypertension: Secondary | ICD-10-CM | POA: Insufficient documentation

## 2018-04-30 DIAGNOSIS — Z20828 Contact with and (suspected) exposure to other viral communicable diseases: Secondary | ICD-10-CM | POA: Insufficient documentation

## 2018-04-30 DIAGNOSIS — R55 Syncope and collapse: Secondary | ICD-10-CM | POA: Insufficient documentation

## 2018-04-30 DIAGNOSIS — J189 Pneumonia, unspecified organism: Secondary | ICD-10-CM | POA: Diagnosis not present

## 2018-04-30 DIAGNOSIS — R918 Other nonspecific abnormal finding of lung field: Secondary | ICD-10-CM | POA: Diagnosis not present

## 2018-04-30 DIAGNOSIS — R509 Fever, unspecified: Secondary | ICD-10-CM

## 2018-04-30 DIAGNOSIS — J069 Acute upper respiratory infection, unspecified: Secondary | ICD-10-CM

## 2018-04-30 DIAGNOSIS — J181 Lobar pneumonia, unspecified organism: Secondary | ICD-10-CM | POA: Diagnosis not present

## 2018-04-30 DIAGNOSIS — E860 Dehydration: Secondary | ICD-10-CM

## 2018-04-30 DIAGNOSIS — Z79899 Other long term (current) drug therapy: Secondary | ICD-10-CM | POA: Insufficient documentation

## 2018-04-30 DIAGNOSIS — R05 Cough: Secondary | ICD-10-CM | POA: Diagnosis not present

## 2018-04-30 LAB — COMPREHENSIVE METABOLIC PANEL
ALBUMIN: 3.4 g/dL — AB (ref 3.5–5.0)
ALT: 16 U/L (ref 0–44)
ANION GAP: 12 (ref 5–15)
AST: 32 U/L (ref 15–41)
Alkaline Phosphatase: 39 U/L (ref 38–126)
BUN: 6 mg/dL (ref 6–20)
CHLORIDE: 100 mmol/L (ref 98–111)
CO2: 23 mmol/L (ref 22–32)
Calcium: 8.2 mg/dL — ABNORMAL LOW (ref 8.9–10.3)
Creatinine, Ser: 0.94 mg/dL (ref 0.44–1.00)
GFR calc Af Amer: >60 mL/min (ref >60)
GFR calc non Af Amer: >60 mL/min (ref >60)
GLUCOSE: 164 mg/dL — AB (ref 70–99)
POTASSIUM: 2.8 mmol/L — AB (ref 3.5–5.1)
SODIUM: 135 mmol/L (ref 135–145)
TOTAL PROTEIN: 6.6 g/dL (ref 6.5–8.1)
Total Bilirubin: 0.5 mg/dL (ref 0.3–1.2)

## 2018-04-30 LAB — CBC
HEMATOCRIT: 35.2 % — AB (ref 36.0–46.0)
Hemoglobin: 12 g/dL (ref 12.0–15.0)
MCH: 29.7 pg (ref 26.0–34.0)
MCHC: 34.1 g/dL (ref 30.0–36.0)
MCV: 87.1 fL (ref 80.0–100.0)
NRBC: 0 % (ref 0.0–0.2)
PLATELETS: 123 10*3/uL — AB (ref 150–400)
RBC: 4.04 MIL/uL (ref 3.87–5.11)
RDW: 12.5 % (ref 11.5–15.5)
WBC: 3.4 10*3/uL — AB (ref 4.0–10.5)

## 2018-04-30 LAB — LACTIC ACID, PLASMA: Lactic Acid, Venous: 2.1 mmol/L (ref 0.5–1.9)

## 2018-04-30 MED ORDER — SODIUM CHLORIDE 0.9 % IV BOLUS
1000.0000 mL | Freq: Once | INTRAVENOUS | Status: AC
Start: 2018-04-30 — End: 2018-04-30
  Administered 2018-04-30: 1000 mL via INTRAVENOUS

## 2018-04-30 MED ORDER — SODIUM CHLORIDE 0.9 % IV SOLN
1.0000 g | Freq: Once | INTRAVENOUS | Status: AC
  Administered 2018-04-30: 1 g via INTRAVENOUS
  Filled 2018-04-30: qty 10

## 2018-04-30 MED ORDER — SODIUM CHLORIDE 0.9 % IV BOLUS
1000.0000 mL | Freq: Once | INTRAVENOUS | Status: AC
  Administered 2018-04-30: 1000 mL via INTRAVENOUS

## 2018-04-30 MED ORDER — DOXYCYCLINE HYCLATE 100 MG PO CAPS: 100.0000 mg | ORAL_CAPSULE | Freq: Two times a day (BID) | ORAL | 0 refills | Status: AC

## 2018-04-30 MED ORDER — AZITHROMYCIN 500 MG IV SOLR
500.0000 mg | Freq: Once | INTRAVENOUS | Status: AC
  Administered 2018-04-30: 500 mg via INTRAVENOUS
  Filled 2018-04-30: qty 500

## 2018-04-30 MED ORDER — ALBUTEROL SULFATE HFA 108 (90 BASE) MCG/ACT IN AERS
2.0000 | INHALATION_SPRAY | RESPIRATORY_TRACT | Status: DC
  Administered 2018-04-30: 2 via RESPIRATORY_TRACT
  Filled 2018-04-30: qty 6.7

## 2018-04-30 MED ORDER — POTASSIUM CHLORIDE CRYS ER 20 MEQ PO TBCR
40.0000 meq | EXTENDED_RELEASE_TABLET | Freq: Once | ORAL | Status: AC
  Administered 2018-04-30: 40 meq via ORAL
  Filled 2018-04-30: qty 2

## 2018-04-30 MED ORDER — ACETAMINOPHEN 500 MG PO TABS
1000.0000 mg | ORAL_TABLET | Freq: Once | ORAL | Status: AC
  Administered 2018-04-30: 1000 mg via ORAL
  Filled 2018-04-30: qty 2

## 2018-04-30 NOTE — ED Provider Notes (Signed)
[facility]    CSN: [id number] Arrival date & time: 04/30/18  1135     History   Chief Complaint Chief Complaint  Patient presents with  . Fatigue  . Generalized Body Aches    HPI Leah Harvey is a 58 y.o. female history of hyperlipidemia, hypertension presenting today for evaluation of persistent fever, fatigue and body aches.  She notes that last weekend she started to develop fever as well as a cough.  She was seen through [facility] and tested negative for flu, strep.  She was also tested for COVID-19 but results have not returned.  She is presenting today as she has persistently felt increasingly weak, tired and has had persistent fever.  She was treated with albuterol, azithromycin and Tessalon.  She does feel her cough has improved and denies shortness of breath.  But she has had poor oral intake and complaining of chills.  HPI  Past Medical History:  Diagnosis Date  . Hypercholesteremia     Patient Active Problem List   Diagnosis Date Noted  . Chest pain, rule out acute myocardial infarction 11/20/2016  . Hyperlipidemia 11/20/2016  . Elevated fasting glucose 11/20/2016  . Hypertension 11/20/2016    Past Surgical History:  Procedure Laterality Date  . No prior surgery      OB History   No obstetric history on file.      Home Medications    Prior to Admission medications   Medication Sig Start Date End Date Taking? Authorizing Provider  Cholecalciferol (VITAMIN D PO) Take 1 tablet by mouth daily.    [provider]  metoprolol succinate (TOPROL-XL) 25 MG 24 hr tablet Take 1 tablet (25 mg total) by mouth daily. 05/31/17   Lewayne Bunting, MD  Multiple Vitamin (MULTIVITAMIN WITH MINERALS) TABS tablet Take 1 tablet by mouth daily.    [provider]  Omega-3 Fatty Acids (FISH OIL PO) Take 1 tablet by mouth daily.    [provider]  pravastatin (PRAVACHOL) 80 MG tablet Take 1 tablet (80 mg total) by mouth daily. 05/31/17    Lewayne Bunting, MD    Family History Family History  Problem Relation Age of Onset  . Sickle cell anemia Father     Social History Social History   Tobacco Use  . Smoking status: Never Smoker  . Smokeless tobacco: Never Used  Substance Use Topics  . Alcohol use: No  . Drug use: No     Allergies   Patient has no known allergies.   Review of Systems Review of Systems  Constitutional: Positive for activity change, appetite change, chills, fatigue and fever.  HENT: Positive for congestion. Negative for ear pain, rhinorrhea, sinus pressure, sore throat and trouble swallowing.   Eyes: Negative for discharge and redness.  Respiratory: Positive for cough. Negative for chest tightness and shortness of breath.   Cardiovascular: Negative for chest pain.  Gastrointestinal: Negative for abdominal pain, diarrhea, nausea and vomiting.  Musculoskeletal: Positive for arthralgias and myalgias.  Skin: Negative for rash.  Neurological: Positive for light-headedness. Negative for dizziness and headaches.     Physical Exam Triage Vital Signs ED Triage Vitals  Enc Vitals Group     BP 04/30/18 1203 (!) 100/49     Pulse --      Resp 04/30/18 1203 16     Temp 04/30/18 1203 (!) 102.2 F (39 C)     Temp Source 04/30/18 1203 Oral     SpO2 04/30/18 1203 100 %  Weight --      Height --      Head Circumference --      Peak Flow --      Pain Score 04/30/18 1204 10     Pain Loc --      Pain Edu? --      Excl. in GC? --    No data found.  Updated Vital Signs BP (!) 100/49 (BP Location: Right Arm)   Temp (!) 102.2 F (39 C) (Oral)   Resp 16   LMP  (LMP Unknown)   SpO2 100%  Pulse 95  Visual Acuity Right Eye Distance:   Left Eye Distance:   Bilateral Distance:    Right Eye Near:   Left Eye Near:    Bilateral Near:     Physical Exam Vitals signs and nursing note reviewed.  Constitutional:      Appearance: She is well-developed.     Comments: Lying on bed curled up,  moaning frequently  HENT:     Head: Normocephalic and atraumatic.  Eyes:     Conjunctiva/sclera: Conjunctivae normal.  Neck:     Musculoskeletal: Neck supple.  Cardiovascular:     Rate and Rhythm: Normal rate and regular rhythm.     Heart sounds: No murmur.  Pulmonary:     Effort: Pulmonary effort is normal. No respiratory distress.     Breath sounds: Normal breath sounds.     Comments: Breathing comfortably at rest, CTABL, no wheezing, rales or other adventitious sounds auscultated Abdominal:     Palpations: Abdomen is soft.     Tenderness: There is no abdominal tenderness.  Skin:    General: Skin is warm and dry.  Neurological:     Mental Status: She is alert.      UC Treatments / Results  Labs (all labs ordered are listed, but only abnormal results are displayed) Labs Reviewed - No data to display  EKG None  Radiology No results found.  Procedures Procedures (including critical care time)  Medications Ordered in UC Medications - No data to display  Initial Impression / Assessment and Plan / UC Course  I have reviewed the triage vital signs and the nursing notes.  Pertinent labs & imaging results that were available during my care of the patient were reviewed by me and considered in my medical decision making (see chart for details).     Discussed case with Dr. Tracie Harrier who is agreeable to plan.  58 year old female with week long history of fever, pending COVID-19 testing testing that has yet to result.  Patient has had persistent fevers, body aches, chills, has progressively felt more fatigued and weak.  She has had improvement in her cough and denies shortness of breath.  Her blood pressure is lower than what she typically runs, her blood pressure was rechecked and was 119/66, but given her worsening weakness with persistent fever recommending further valuation work-up in emergency room prior to discharging home as likely would benefit from basic labs, and  monitoring prior to discharge home.  Her oxygen is stable at this point.  Patient verbalized understanding and was transported via wheelchair with nursing staff.  Final Clinical Impressions(s) / UC Diagnoses   Final diagnoses:  Fever, unspecified     Discharge Instructions     Please go to emergency room for further workup    ED Prescriptions    None     Controlled Substance Prescriptions [place] Controlled Substance Registry consulted? Not Applicable   Sharyon Cable, Horace  C, PA-C 04/30/18 1306

## 2018-04-30 NOTE — ED Triage Notes (Signed)
Pt present fatigue and body aches. Pt states symptom has been going on for over a week.

## 2018-04-30 NOTE — ED Provider Notes (Signed)
[facility] EMERGENCY DEPARTMENT Provider Note   CSN: [id number] Arrival date & time: 04/30/18  1626    History   Chief Complaint Chief Complaint  Patient presents with   Fever   Near Syncope    HPI Leah Harvey is a 58 y.o. female.     HPI Patient is a 58 year old female who has had intermittent fever over the past week.  She has had coronavirus testing performed in the [place] system.  She is awaiting the results.  She is currently a patient under investigation.  She was seen in this emergency department earlier and diagnosed with right upper lobe pneumonia and was able to be discharged home secondary to no increased work of breathing or hypoxia.  She was prescribed doxycycline.  She left the emergency department and went to the grocery store where she reportedly had a syncopal episode.  No reported seizure activity.  Patient states decreased oral intake over the past several days.  She feels weak and tired.  She denies chest pain.  She denies shortness of breath.  She reports cough.  She has had negative influenza and negative strep testing as an outpatient.   Past Medical History:  Diagnosis Date   Hypercholesteremia     Patient Active Problem List   Diagnosis Date Noted   Chest pain, rule out acute myocardial infarction 11/20/2016   Hyperlipidemia 11/20/2016   Elevated fasting glucose 11/20/2016   Hypertension 11/20/2016    Past Surgical History:  Procedure Laterality Date   No prior surgery       OB History   No obstetric history on file.      Home Medications    Prior to Admission medications   Medication Sig Start Date End Date Taking? Authorizing Provider  Cholecalciferol (VITAMIN D PO) Take 1 tablet by mouth daily.    [provider]  doxycycline (VIBRAMYCIN) 100 MG capsule Take 1 capsule (100 mg total) by mouth 2 (two) times daily for 10 days. 04/30/18 05/10/18  Curatolo, Adam, DO  metoprolol succinate (TOPROL-XL) 25 MG  24 hr tablet Take 1 tablet (25 mg total) by mouth daily. 05/31/17   Lewayne Bunting, MD  Multiple Vitamin (MULTIVITAMIN WITH MINERALS) TABS tablet Take 1 tablet by mouth daily.    [provider]  Omega-3 Fatty Acids (FISH OIL PO) Take 1 tablet by mouth daily.    [provider]  pravastatin (PRAVACHOL) 80 MG tablet Take 1 tablet (80 mg total) by mouth daily. 05/31/17   Lewayne Bunting, MD    Family History Family History  Problem Relation Age of Onset   Sickle cell anemia Father     Social History Social History   Tobacco Use   Smoking status: Never Smoker   Smokeless tobacco: Never Used  Substance Use Topics   Alcohol use: No   Drug use: No     Allergies   Patient has no known allergies.   Review of Systems Review of Systems  All other systems reviewed and are negative.    Physical Exam Updated Vital Signs BP 122/75 (BP Location: Right Arm)    Pulse 79    Temp (!) 100.5 F (38.1 C) (Oral)    Resp (!) 24    Ht  (1.626 m)    Wt 76.2 kg    LMP  (LMP Unknown)    SpO2 100%    BMI 28.84 kg/m   Physical Exam Vitals signs (POSSIBLE COVID RESTRICTED EVALUATION VIA TELEPHONE PER  HOSPITAL PROTOCOL, EVALUTUATED VISUALLY THROUGH THE DOOR WINDOW) and nursing note reviewed.  Constitutional:      General: She is not in acute distress.    Appearance: She is well-developed.  HENT:     Head: Normocephalic and atraumatic.  Neck:     Musculoskeletal: Normal range of motion.  Cardiovascular:     Rate and Rhythm: Normal rate.     Heart sounds: Normal heart sounds.  Pulmonary:     Effort: Pulmonary effort is normal.  Abdominal:     General: There is no distension.  Musculoskeletal: Normal range of motion.  Skin:    Coloration: Skin is not jaundiced.  Neurological:     Mental Status: She is alert and oriented to person, place, and time.  Psychiatric:        Judgment: Judgment normal.      ED Treatments / Results  Labs (all labs ordered are  listed, but only abnormal results are displayed) Labs Reviewed  CBC - Abnormal; Notable for the following components:      Result Value   WBC 3.4 (*)    HCT 35.2 (*)    Platelets 123 (*)    All other components within normal limits  COMPREHENSIVE METABOLIC PANEL - Abnormal; Notable for the following components:   Potassium 2.8 (*)    Glucose, Bld 164 (*)    Calcium 8.2 (*)    Albumin 3.4 (*)    All other components within normal limits  LACTIC ACID, PLASMA - Abnormal; Notable for the following components:   Lactic Acid, Venous 2.1 (*)    All other components within normal limits    EKG EKG Interpretation  Date/Time:  Saturday April 30 2018 16:38:51 EDT Ventricular Rate:  82 PR Interval:    QRS Duration: 87 QT Interval:  355 QTC Calculation: 415 R Axis:   42 Text Interpretation:  Sinus rhythm Consider right atrial enlargement Borderline repolarization abnormality No significant change was found Reconfirmed by Azalia Bilis ([id number]) on 04/30/2018 10:44:35 PM   Radiology Dg Chest Portable 1 View  Result Date: 04/30/2018 CLINICAL DATA:  Fever. EXAM: PORTABLE CHEST 1 VIEW COMPARISON:  Radiographs of November 19, 2016. CT scan of November 20, 2016. FINDINGS: The heart size and mediastinal contours are within normal limits. No pneumothorax or pleural effusion is noted. Left lung is clear. Mild right upper lobe airspace opacity is noted concerning for possible pneumonia or atelectasis. The visualized skeletal structures are unremarkable. IMPRESSION: Mild right upper lobe opacity is noted concerning for pneumonia or atelectasis. Followup PA and lateral chest X-ray is recommended in 3-4 weeks following trial of antibiotic therapy to ensure resolution and exclude underlying malignancy. Electronically Signed   By: Lupita Raider, M.D.   On: 04/30/2018 14:14    Procedures Procedures (including critical care time)  Medications Ordered in ED Medications  sodium chloride 0.9 % bolus 1,000 mL  (0 mLs Intravenous Stopped 04/30/18 1910)  cefTRIAXone (ROCEPHIN) 1 g in sodium chloride 0.9 % 100 mL IVPB (0 g Intravenous Stopped 04/30/18 1930)  azithromycin (ZITHROMAX) 500 mg in sodium chloride 0.9 % 250 mL IVPB (0 mg Intravenous Stopped 04/30/18 1933)  sodium chloride 0.9 % bolus 1,000 mL (0 mLs Intravenous Stopped 04/30/18 2110)  potassium chloride SA (K-DUR,KLOR-CON) CR tablet 40 mEq (40 mEq Oral Given 04/30/18 1903)     Initial Impression / Assessment and Plan / ED Course  I have reviewed the triage vital signs and the nursing notes.  Pertinent labs & imaging results  that were available during my care of the patient were reviewed by me and considered in my medical decision making (see chart for details).       Patient feels much better after IV fluids.  Likely dehydrated.  No hypoxia.  O2 sats in percent.  No increased work of breathing.  Patient given Rocephin and azithromycin here in the emergency department.  She will fill the prescription for doxycycline and take her first dose in the morning.  COVID-19 is definitely a real possibility but at this time with no increased work of breathing and no hypoxia she does not need to be admitted to the hospital.  Her coronavirus testing is still pending through the [place] system.  She will need to continue to quarantine at home.  I have stressed the importance of not going into public including to the grocery store like she did today.  Note occasion for admission the hospital or additional testing at this time.  No preceding chest pain or palpitations.  Work-up in the emergency department otherwise without significant abnormality except for mild hypokalemia.  Encouraged potassium rich food  10:14 PM Attempted to call daughter Basil Dess [phone]). No answer. No message left given HIPPA  Final Clinical Impressions(s) / ED Diagnoses   Final diagnoses:  Dehydration    ED Discharge Orders    None       Azalia Bilis, MD 04/30/18 2246

## 2018-04-30 NOTE — Discharge Instructions (Signed)
Control Measures  Patients who have symptoms consistent with COVID-19 should self-isolate for:  -At least 3 days (72 hours) have passed since recovery defined as resolution of fever without the use of fever-reducing medications and improvement in respiratory symptoms (e.g., cough, shortness of breath)  AND  -At least 7 days have passed since symptoms first appeared.  Close contacts of a person with known or suspected COVID-19 should self-monitor their temperature and symptoms of COVID-19, limit outside interaction as much as possible for 14 days, and self-isolate if they develop symptoms.    Please fill and take the antibiotics prescribed for pneumonia

## 2018-04-30 NOTE — ED Notes (Signed)
Pt updated on care and spoke with doctor. Ensured pt comfortable and secured

## 2018-04-30 NOTE — ED Notes (Signed)
Patient verbalizes understanding of discharge instructions. Opportunity for questioning and answers were provided. Armband removed by staff, pt discharged from ED by wheelchair   

## 2018-04-30 NOTE — Discharge Instructions (Addendum)
We will start antibiotic for likely lung infection.  Continue to take Tylenol 1000 mg every 4 hours for fever greater than 100.4.  Please return to the ED if you have any change in symptoms including extreme shortness of breath.  Continue home quarantine as you have been doing.  Continue to call your primary care doctor as well as needed.   If you live with, or provide care at home for, a person confirmed to have, or being evaluated for, COVID-19 infection please follow these guidelines to prevent infection:  Follow healthcare providers instructions Make sure that you understand and can help the patient follow any healthcare provider instructions for all care.  Provide for the patients basic needs You should help the patient with basic needs in the home and provide support for getting groceries, prescriptions, and other personal needs.  Monitor the patients symptoms If they are getting sicker, call his or her medical provider a  This will help the healthcare providers office take steps to keep other people from getting infected. Ask the healthcare provider to call the local or state health department.  Limit the number of people who have contact with the patient If possible, have only one caregiver for the patient. Other household members should stay in another home or place of residence. If this is not possible, they should stay in another room, or be separated from the patient as much as possible. Use a separate bathroom, if available. Restrict visitors who do not have an essential need to be in the home.  Keep older adults, very young children, and other sick people away from the patient Keep older adults, very young children, and those who have compromised immune systems or chronic health conditions away from the patient. This includes people with chronic heart, lung, or kidney conditions, diabetes, and cancer.  Ensure good ventilation Make sure that shared spaces in the home have  good air flow, such as from an air conditioner or an opened window, weather permitting.  Wash your hands often Wash your hands often and thoroughly with soap and water for at least 20 seconds. You can use an alcohol based hand sanitizer if soap and water are not available and if your hands are not visibly dirty. Avoid touching your eyes, nose, and mouth with unwashed hands. Use disposable paper towels to dry your hands. If not available, use dedicated cloth towels and replace them when they become wet.  Wear a facemask and gloves Wear a disposable facemask at all times in the room and gloves when you touch or have contact with the patients blood, body fluids, and/or secretions or excretions, such as sweat, saliva, sputum, nasal mucus, vomit, urine, or feces.  Ensure the mask fits over your nose and mouth tightly, and do not touch it during use. Throw out disposable facemasks and gloves after using them. Do not reuse. Wash your hands immediately after removing your facemask and gloves. If your personal clothing becomes contaminated, carefully remove clothing and launder. Wash your hands after handling contaminated clothing. Place all used disposable facemasks, gloves, and other waste in a lined container before disposing them with other household waste. Remove gloves and wash your hands immediately after handling these items.  Do not share dishes, glasses, or other household items with the patient Avoid sharing household items. You should not share dishes, drinking glasses, cups, eating utensils, towels, bedding, or other items After the person uses these items, you should wash them thoroughly with soap and water.  Wash  laundry thoroughly Immediately remove and wash clothes or bedding that have blood, body fluids, and/or secretions or excretions, such as sweat, saliva, sputum, nasal mucus, vomit, urine, or feces, on them. Wear gloves when handling laundry from the patient. Read and follow  directions on labels of laundry or clothing items and detergent. In general, wash and dry with the warmest temperatures recommended on the label.  Clean all areas the individual has used often Clean all touchable surfaces, such as counters, tabletops, doorknobs, bathroom fixtures, toilets, phones, keyboards, tablets, and bedside tables, every day. Also, clean any surfaces that may have blood, body fluids, and/or secretions or excretions on them. Wear gloves when cleaning surfaces the patient has come in contact with. Use a diluted bleach solution (e.g., dilute bleach with 1 part bleach and 10 parts water) or a household disinfectant with a label that says EPA-registered for coronaviruses. To make a bleach solution at home, add 1 tablespoon of bleach to 1 quart (4 cups) of water. For a larger supply, add  cup of bleach to 1 gallon (16 cups) of water. Read labels of cleaning products and follow recommendations provided on product labels. Labels contain instructions for safe and effective use of the cleaning product including precautions you should take when applying the product, such as wearing gloves or eye protection and making sure you have good ventilation during use of the product. Remove gloves and wash hands immediately after cleaning.  Monitor yourself for signs and symptoms of illness Caregivers and household members are considered close contacts, should monitor their health, and will be asked to limit movement outside of the home to the extent possible. Follow the monitoring steps for close contacts listed on the symptom monitoring form.   ? If you have additional questions, contact your local health department or call the epidemiologist on call at [phone] (available 24/7). ? This guidance is subject to change. For the most up-to-date guidance from [facility], please refer to their website: [email or web address]

## 2018-04-30 NOTE — ED Provider Notes (Signed)
[facility] EMERGENCY DEPARTMENT Provider Note   CSN: [id number] Arrival date & time: 04/30/18  1311    History   Chief Complaint Chief Complaint  Patient presents with  . Fever    HPI Leah Harvey is a 58 y.o. female.     The history is provided by the patient.  Fever  Max temp prior to arrival:  102 Temp source:  Oral Severity:  Mild Onset quality:  Gradual Duration:  8 days Timing:  Intermittent Progression:  Waxing and waning Chronicity:  New Relieved by:  Acetaminophen and ibuprofen Worsened by:  Nothing Associated symptoms: chills, cough and myalgias   Associated symptoms: no chest pain, no dysuria, no ear pain, no rash, no rhinorrhea, no sore throat and no vomiting   Risk factors: recent sickness     Past Medical History:  Diagnosis Date  . Hypercholesteremia     Patient Active Problem List   Diagnosis Date Noted  . Chest pain, rule out acute myocardial infarction 11/20/2016  . Hyperlipidemia 11/20/2016  . Elevated fasting glucose 11/20/2016  . Hypertension 11/20/2016    Past Surgical History:  Procedure Laterality Date  . No prior surgery       OB History   No obstetric history on file.      Home Medications    Prior to Admission medications   Medication Sig Start Date End Date Taking? Authorizing Provider  Cholecalciferol (VITAMIN D PO) Take 1 tablet by mouth daily.    [provider]  doxycycline (VIBRAMYCIN) 100 MG capsule Take 1 capsule (100 mg total) by mouth 2 (two) times daily for 10 days. 04/30/18 05/10/18  Burel Kahre, DO  metoprolol succinate (TOPROL-XL) 25 MG 24 hr tablet Take 1 tablet (25 mg total) by mouth daily. 05/31/17   Lewayne Bunting, MD  Multiple Vitamin (MULTIVITAMIN WITH MINERALS) TABS tablet Take 1 tablet by mouth daily.    [provider]  Omega-3 Fatty Acids (FISH OIL PO) Take 1 tablet by mouth daily.    [provider]  pravastatin (PRAVACHOL) 80 MG tablet Take 1  tablet (80 mg total) by mouth daily. 05/31/17   Lewayne Bunting, MD    Family History Family History  Problem Relation Age of Onset  . Sickle cell anemia Father     Social History Social History   Tobacco Use  . Smoking status: Never Smoker  . Smokeless tobacco: Never Used  Substance Use Topics  . Alcohol use: No  . Drug use: No     Allergies   Patient has no known allergies.   Review of Systems Review of Systems  Constitutional: Positive for chills and fever.  HENT: Negative for ear pain, rhinorrhea and sore throat.   Eyes: Negative for pain and visual disturbance.  Respiratory: Positive for cough. Negative for shortness of breath.   Cardiovascular: Negative for chest pain and palpitations.  Gastrointestinal: Negative for abdominal pain and vomiting.  Genitourinary: Negative for dysuria and hematuria.  Musculoskeletal: Positive for myalgias. Negative for arthralgias and back pain.  Skin: Negative for color change and rash.  Neurological: Negative for seizures and syncope.  All other systems reviewed and are negative.    Physical Exam  ED Triage Vitals [04/30/18 1327]  Enc Vitals Group     BP (!) 149/72     Pulse Rate 87     Resp 19     Temp 100.2 F (37.9 C)     Temp Source Oral  SpO2 97 %     Weight      Height      Head Circumference      Peak Flow      Pain Score      Pain Loc      Pain Edu?      Excl. in GC?     Physical Exam Vitals signs and nursing note reviewed.  Constitutional:      General: She is not in acute distress.    Appearance: She is well-developed. She is not ill-appearing.  HENT:     Head: Normocephalic and atraumatic.     Nose: Nose normal.     Mouth/Throat:     Mouth: Mucous membranes are moist.  Eyes:     Extraocular Movements: Extraocular movements intact.     Conjunctiva/sclera: Conjunctivae normal.     Pupils: Pupils are equal, round, and reactive to light.  Neck:     Musculoskeletal: Neck supple.   Cardiovascular:     Rate and Rhythm: Normal rate and regular rhythm.     Pulses: Normal pulses.     Heart sounds: Normal heart sounds. No murmur.  Pulmonary:     Effort: Pulmonary effort is normal. No respiratory distress.  Abdominal:     Palpations: Abdomen is soft.     Tenderness: There is no abdominal tenderness.  Musculoskeletal: Normal range of motion.  Skin:    General: Skin is warm and dry.     Capillary Refill: Capillary refill takes less than 2 seconds.  Neurological:     General: No focal deficit present.     Mental Status: She is alert.      ED Treatments / Results  Labs (all labs ordered are listed, but only abnormal results are displayed) Labs Reviewed - No data to display  EKG None  Radiology Dg Chest Portable 1 View  Result Date: 04/30/2018 CLINICAL DATA:  Fever. EXAM: PORTABLE CHEST 1 VIEW COMPARISON:  Radiographs of November 19, 2016. CT scan of November 20, 2016. FINDINGS: The heart size and mediastinal contours are within normal limits. No pneumothorax or pleural effusion is noted. Left lung is clear. Mild right upper lobe airspace opacity is noted concerning for possible pneumonia or atelectasis. The visualized skeletal structures are unremarkable. IMPRESSION: Mild right upper lobe opacity is noted concerning for pneumonia or atelectasis. Followup PA and lateral chest X-ray is recommended in 3-4 weeks following trial of antibiotic therapy to ensure resolution and exclude underlying malignancy. Electronically Signed   By: Lupita Raider, M.D.   On: 04/30/2018 14:14    Procedures Procedures (including critical care time)  Medications Ordered in ED Medications  acetaminophen (TYLENOL) tablet 1,000 mg (has no administration in time range)     Initial Impression / Assessment and Plan / ED Course  I have reviewed the triage vital signs and the nursing notes.  Pertinent labs & imaging results that were available during my care of the patient were reviewed by  me and considered in my medical decision making (see chart for details).     Leah Harvey is a 58 year old female with history of hypertension, high cholesterol who has had fever intermittently over the last 8 days.  Patient with unremarkable vitals here.  No signs of respiratory distress.  100% on room air.  Patient had coronavirus testing done at [facility] several days ago and has been in quarantine at home.  Patient had negative influenza test and strep screen at that time.  Was sent from  urgent care for second opinion.  She overall appears comfortable.  Likely has ongoing viral illness.  Possibly coronavirus.  Will obtain chest x-ray.   Chest x-ray shows right upper lobe pneumonia.  Patient otherwise is well-appearing.  Will start doxycycline.  Suspect that this is likely coronavirus.  Given further quarantine precautions but she already has been under quarantine for coronavirus.  Patient has normal work of breathing.  100% on room air.  Recommend continued use of Tylenol.  Given information about coronavirus.  Recommend continued use of Tylenol and hydration.  Given return precautions.  Leah Harvey was evaluated in Emergency Department on 04/30/2018 for the symptoms described in the history of present illness. She was evaluated in the context of the global COVID-19 pandemic, which necessitated consideration that the patient might be at risk for infection with the SARS-CoV-2 virus that causes COVID-19. Institutional protocols and algorithms that pertain to the evaluation of patients at risk for COVID-19 are in a state of rapid change based on information released by regulatory bodies including the CDC and federal and state organizations. These policies and algorithms were followed during the patient's care in the ED.  This chart was dictated using voice recognition software.  Despite best efforts to proofread,  errors can occur which can change the documentation meaning.   Final Clinical  Impressions(s) / ED Diagnoses   Final diagnoses:  Community acquired pneumonia of right upper lobe of lung (HCC)  Upper respiratory tract infection, unspecified type    ED Discharge Orders         Ordered    doxycycline (VIBRAMYCIN) 100 MG capsule  2 times daily     04/30/18 1423           Ring[place], DO 04/30/18 1424

## 2018-04-30 NOTE — ED Notes (Signed)
Notified MD of pt's lactic acid 2.1

## 2018-04-30 NOTE — ED Notes (Signed)
Patient transported by staff to ED via wheelchair for further evaluation and care.

## 2018-04-30 NOTE — ED Notes (Signed)
Discharge instructions and prescription discussed with Pt. Pt verbalized understanding. Pt stable and ambulatory.    

## 2018-04-30 NOTE — ED Triage Notes (Signed)
Pt sent by [facility] for fever x 1 week with body aches.

## 2018-04-30 NOTE — ED Triage Notes (Signed)
Pt was seen earlier today in ED for respiratory evaluation; pt was dx'd with pneumonia; pt c/o SHOB, cough, weakness, andd fever x1 weak. Upon d/c pt passed out. Temperature currently 100.5.

## 2018-04-30 NOTE — ED Notes (Signed)
Daughter's would like update when able- [place] - [phone] [place]- [phone]

## 2018-04-30 NOTE — Discharge Instructions (Signed)
Please go to emergency room for further workup

## 2018-05-01 DIAGNOSIS — R42 Dizziness and giddiness: Secondary | ICD-10-CM | POA: Diagnosis not present

## 2018-05-01 DIAGNOSIS — R531 Weakness: Secondary | ICD-10-CM | POA: Diagnosis not present

## 2018-05-01 DIAGNOSIS — R55 Syncope and collapse: Secondary | ICD-10-CM | POA: Diagnosis not present

## 2018-05-05 ENCOUNTER — Other Ambulatory Visit: Payer: Self-pay

## 2018-05-05 ENCOUNTER — Encounter (HOSPITAL_BASED_OUTPATIENT_CLINIC_OR_DEPARTMENT_OTHER): Payer: Self-pay | Admitting: *Deleted

## 2018-05-05 ENCOUNTER — Emergency Department (HOSPITAL_BASED_OUTPATIENT_CLINIC_OR_DEPARTMENT_OTHER)
Admission: EM | Admit: 2018-05-05 | Discharge: 2018-05-05 | Disposition: A | Discharge status: Home / Self Care | Payer: BLUE CROSS/BLUE SHIELD | Attending: Emergency Medicine | Admitting: Emergency Medicine

## 2018-05-05 DIAGNOSIS — R059 Cough, unspecified: Secondary | ICD-10-CM

## 2018-05-05 DIAGNOSIS — I10 Essential (primary) hypertension: Secondary | ICD-10-CM | POA: Insufficient documentation

## 2018-05-05 DIAGNOSIS — R102 Pelvic and perineal pain: Secondary | ICD-10-CM

## 2018-05-05 DIAGNOSIS — R0602 Shortness of breath: Secondary | ICD-10-CM | POA: Diagnosis not present

## 2018-05-05 DIAGNOSIS — Z20828 Contact with and (suspected) exposure to other viral communicable diseases: Secondary | ICD-10-CM | POA: Diagnosis not present

## 2018-05-05 DIAGNOSIS — R05 Cough: Secondary | ICD-10-CM | POA: Insufficient documentation

## 2018-05-05 DIAGNOSIS — R1030 Lower abdominal pain, unspecified: Secondary | ICD-10-CM | POA: Diagnosis not present

## 2018-05-05 DIAGNOSIS — Z79899 Other long term (current) drug therapy: Secondary | ICD-10-CM | POA: Diagnosis not present

## 2018-05-05 LAB — URINALYSIS, ROUTINE W REFLEX MICROSCOPIC
Bilirubin Urine: NEGATIVE
Glucose, UA: NEGATIVE mg/dL
Ketones, ur: NEGATIVE mg/dL
Leukocytes,Ua: NEGATIVE
Nitrite: NEGATIVE
Protein, ur: 30 mg/dL — AB
Specific Gravity, Urine: 1.01 (ref 1.005–1.030)
pH: 5.5 (ref 5.0–8.0)

## 2018-05-05 LAB — COMPREHENSIVE METABOLIC PANEL
ALT: 40 U/L (ref 0–44)
AST: 64 U/L — ABNORMAL HIGH (ref 15–41)
Albumin: 2.8 g/dL — ABNORMAL LOW (ref 3.5–5.0)
Alkaline Phosphatase: 41 U/L (ref 38–126)
Anion gap: 10 (ref 5–15)
BUN: 12 mg/dL (ref 6–20)
CO2: 26 mmol/L (ref 22–32)
Calcium: 8.1 mg/dL — ABNORMAL LOW (ref 8.9–10.3)
Chloride: 102 mmol/L (ref 98–111)
Creatinine, Ser: 0.71 mg/dL (ref 0.44–1.00)
GFR calc Af Amer: >60 mL/min (ref >60)
GFR calc non Af Amer: >60 mL/min (ref >60)
Glucose, Bld: 103 mg/dL — ABNORMAL HIGH (ref 70–99)
Potassium: 3.3 mmol/L — ABNORMAL LOW (ref 3.5–5.1)
Sodium: 138 mmol/L (ref 135–145)
Total Bilirubin: 1 mg/dL (ref 0.3–1.2)
Total Protein: 6.6 g/dL (ref 6.5–8.1)

## 2018-05-05 LAB — CBC WITH DIFFERENTIAL/PLATELET
Abs Immature Granulocytes: 0.03 10*3/uL (ref 0.00–0.07)
Basophils Absolute: 0 10*3/uL (ref 0.0–0.1)
Basophils Relative: 0 %
Eosinophils Absolute: 0 10*3/uL (ref 0.0–0.5)
Eosinophils Relative: 0 %
HCT: 32.2 % — ABNORMAL LOW (ref 36.0–46.0)
Hemoglobin: 10.6 g/dL — ABNORMAL LOW (ref 12.0–15.0)
Immature Granulocytes: 1 %
Lymphocytes Relative: 11 %
Lymphs Abs: 0.7 10*3/uL (ref 0.7–4.0)
MCH: 29.4 pg (ref 26.0–34.0)
MCHC: 32.9 g/dL (ref 30.0–36.0)
MCV: 89.2 fL (ref 80.0–100.0)
Monocytes Absolute: 0.3 10*3/uL (ref 0.1–1.0)
Monocytes Relative: 5 %
Neutro Abs: 4.9 10*3/uL (ref 1.7–7.7)
Neutrophils Relative %: 83 %
Platelets: 205 10*3/uL (ref 150–400)
RBC: 3.61 MIL/uL — ABNORMAL LOW (ref 3.87–5.11)
RDW: 13.3 % (ref 11.5–15.5)
WBC: 6 10*3/uL (ref 4.0–10.5)
nRBC: 0 % (ref 0.0–0.2)

## 2018-05-05 LAB — URINALYSIS, MICROSCOPIC (REFLEX)

## 2018-05-05 LAB — OCCULT BLOOD X 1 CARD TO LAB, STOOL: Fecal Occult Bld: NEGATIVE

## 2018-05-05 LAB — MAGNESIUM: Magnesium: 2 mg/dL (ref 1.7–2.4)

## 2018-05-05 NOTE — Discharge Instructions (Addendum)
Please follow-up with your doctor once you are feeling better for further evaluation and treatment of your hemoglobin drop to 10.6 today.  Please also follow-up with your doctor if your abdominal pain is persisting.  You can take Tylenol as prescribed over-the-counter, as needed for your pain.  A urine culture has been sent of your urine and you will be called if an antibiotic is required.  Please return to the emergency department if you develop any new or worsening symptoms including worsening abdominal pain, shortness of breath, intractable vomiting, significant amounts of bloody stool, return of fever over 100.4, or any other new or concerning symptoms.

## 2018-05-05 NOTE — ED Triage Notes (Addendum)
Cough, fever and SOB. She is waiting for her Covid 77 test to come back that was done March 22. Pt states she was diagnosed with pneumonia 5 days ago and started on Doxycycline.

## 2018-05-05 NOTE — ED Provider Notes (Addendum)
[facility] EMERGENCY DEPARTMENT Provider Note   CSN: [id number] Arrival date & time: 05/05/18  1619    History   Chief Complaint Chief Complaint  Patient presents with  . Fever  . Cough    HPI Leah Harvey is a 58 y.o. female with history of hypertension, hyperlipidemia who presents with 2-week history of cough and shortness of breath.  Patient reports she has not had a fever in 2 or 3 days.  She is not worse, however her shortness of breath is not getting better.  Patient is positive for novel coronavirus per [facility] medical record.  Denies any chest pain, ear pain, sore throat, nausea, vomiting.  Patient reports she started with some suprapubic pain today.  She denies back pain.  She reports some urinary frequency, however she states that seems to be about normal for her.  She denies any dysuria.     HPI  Past Medical History:  Diagnosis Date  . Hypercholesteremia     Patient Active Problem List   Diagnosis Date Noted  . Chest pain, rule out acute myocardial infarction 11/20/2016  . Hyperlipidemia 11/20/2016  . Elevated fasting glucose 11/20/2016  . Hypertension 11/20/2016    Past Surgical History:  Procedure Laterality Date  . No prior surgery       OB History   No obstetric history on file.      Home Medications    Prior to Admission medications   Medication Sig Start Date End Date Taking? Authorizing Provider  acetaminophen (TYLENOL) 500 MG tablet Take 1,000 mg by mouth every 6 (six) hours as needed for fever or headache (pain).    [provider]  albuterol (PROVENTIL HFA;VENTOLIN HFA) 108 (90 Base) MCG/ACT inhaler Inhale 2 puffs into the lungs every 4 (four) hours as needed for wheezing or shortness of breath.  04/24/18   [provider]  Ascorbic Acid (VITAMIN C PO) Take 1 tablet by mouth daily.    [provider]  azithromycin (ZITHROMAX) 250 MG tablet Take 250-500 mg by mouth See admin instructions. Take 2 tablets (500  mg) by mouth 1st day, then take 1 tablet (250 mg) daily on days 2-5 04/24/18   [provider]  benzonatate (TESSALON) 200 MG capsule Take 200 mg by mouth 3 (three) times daily as needed for cough.  04/24/18   [provider]  Calcium Carb-Cholecalciferol (CALCIUM+D3 PO) Take 1 tablet by mouth daily.    [provider]  cycloSPORINE (RESTASIS) 0.05 % ophthalmic emulsion Place 1 drop into both eyes 2 (two) times daily.    [provider]  doxycycline (VIBRAMYCIN) 100 MG capsule Take 1 capsule (100 mg total) by mouth 2 (two) times daily for 10 days. 04/30/18 05/10/18  Curatolo, Adam, DO  ibuprofen (ADVIL,MOTRIN) 200 MG tablet Take 400 mg by mouth every 6 (six) hours as needed for fever or headache (pain).    [provider]  metoprolol succinate (TOPROL-XL) 25 MG 24 hr tablet Take 1 tablet (25 mg total) by mouth daily. Patient taking differently: Take 25 mg by mouth at bedtime.  05/31/17   Lewayne Bunting, MD  Multiple Vitamin (MULTIVITAMIN WITH MINERALS) TABS tablet Take 1 tablet by mouth daily.    [provider]  pravastatin (PRAVACHOL) 80 MG tablet Take 1 tablet (80 mg total) by mouth daily. Patient taking differently: Take 80 mg by mouth at bedtime.  05/31/17   Lewayne Bunting, MD    Family History Family History  Problem Relation  Age of Onset  . Sickle cell anemia Father     Social History Social History   Tobacco Use  . Smoking status: Never Smoker  . Smokeless tobacco: Never Used  Substance Use Topics  . Alcohol use: No  . Drug use: No     Allergies   Patient has no known allergies.   Review of Systems Review of Systems  Constitutional: Negative for chills and fever (resolved).  HENT: Negative for facial swelling and sore throat.   Respiratory: Positive for cough and shortness of breath.   Cardiovascular: Negative for chest pain.  Gastrointestinal: Positive for abdominal pain (suprapubic pain). Negative for  constipation, diarrhea, nausea and vomiting.  Genitourinary: Positive for frequency.  Musculoskeletal: Negative for back pain.  Skin: Negative for rash and wound.  Neurological: Negative for headaches.  Psychiatric/Behavioral: The patient is not nervous/anxious.      Physical Exam Updated Vital Signs BP 111/75   Pulse 92   Temp 99.4 F (37.4 C) (Oral)   Resp 20   Ht  (1.626 m)   Wt 76.2 kg   LMP  (LMP Unknown)   SpO2 96%   BMI 28.84 kg/m   Physical Exam Vitals signs and nursing note reviewed.  Constitutional:      General: She is not in acute distress.    Appearance: She is well-developed. She is not diaphoretic.  HENT:     Head: Normocephalic and atraumatic.     Right Ear: Tympanic membrane normal.     Left Ear: Tympanic membrane normal.     Mouth/Throat:     Pharynx: No oropharyngeal exudate.  Eyes:     General: No scleral icterus.       Right eye: No discharge.        Left eye: No discharge.     Conjunctiva/sclera: Conjunctivae normal.     Pupils: Pupils are equal, round, and reactive to light.  Neck:     Musculoskeletal: Normal range of motion and neck supple.     Thyroid: No thyromegaly.  Cardiovascular:     Rate and Rhythm: Normal rate and regular rhythm.     Heart sounds: Normal heart sounds. No murmur. No friction rub. No gallop.   Pulmonary:     Effort: Pulmonary effort is normal. No respiratory distress.     Breath sounds: Normal breath sounds. No stridor. No wheezing or rales.  Abdominal:     General: Bowel sounds are normal. There is no distension.     Palpations: Abdomen is soft.     Tenderness: There is abdominal tenderness in the suprapubic area. There is no right CVA tenderness, left CVA tenderness, guarding or rebound.  Lymphadenopathy:     Cervical: No cervical adenopathy.  Skin:    General: Skin is warm and dry.     Coloration: Skin is not pale.     Findings: No rash.  Neurological:     Mental Status: She is alert.     Coordination:  Coordination normal.      ED Treatments / Results  Labs (all labs ordered are listed, but only abnormal results are displayed) Labs Reviewed  URINALYSIS, ROUTINE W REFLEX MICROSCOPIC - Abnormal; Notable for the following components:      Result Value   APPearance CLOUDY (*)    Hgb urine dipstick MODERATE (*)    Protein, ur 30 (*)    All other components within normal limits  COMPREHENSIVE METABOLIC PANEL - Abnormal; Notable for the following components:  Potassium 3.3 (*)    Glucose, Bld 103 (*)    Calcium 8.1 (*)    Albumin 2.8 (*)    AST 64 (*)    All other components within normal limits  CBC WITH DIFFERENTIAL/PLATELET - Abnormal; Notable for the following components:   RBC 3.61 (*)    Hemoglobin 10.6 (*)    HCT 32.2 (*)    All other components within normal limits  URINALYSIS, MICROSCOPIC (REFLEX) - Abnormal; Notable for the following components:   Bacteria, UA RARE (*)    All other components within normal limits  URINE CULTURE  MAGNESIUM  OCCULT BLOOD X 1 CARD TO LAB, STOOL    EKG None  Radiology No results found.  Procedures Procedures (including critical care time)  Medications Ordered in ED Medications - No data to display   Initial Impression / Assessment and Plan / ED Course  I have reviewed the triage vital signs and the nursing notes.  Pertinent labs & imaging results that were available during my care of the patient were reviewed by me and considered in my medical decision making (see chart for details).        Patient presenting with ongoing shortness of breath and cough and new suprapubic pain in the setting of known COVID-19.  Potassium is improved from the other day.  Hemoglobin has dropped to 10.6.  Fecal occult is negative.  Cannot find a reason for this at this time, however could be related to COVID-19.  Will refer to PCP for further evaluation of this.  Patient does not have a surgical abdomen.  She has some mild tenderness at  suprapubic region, but no other tenderness at this time.  No indication for imaging at this time. UA shows no signs of infection, but will send for culture.  There is hematuria, however patient states this is normal for her.   This could be related to COVID-19 or musculoskeletal related. CXR on 04/30/2018 showed a mild RUL opacity concerning for atelectasis or pneumonia and patient was treated with doxycycline. Patient's fever has broken and she has no worsening symptoms. In the setting of confirmed positive COVID-19, oxygen saturations and RR within normal limits, I do not feel a repeat CXR would change management today. She is oxygenating between 94 and 100% on room air.  She is not tachypneic.  No indication for admission at this time. Will continue doxycycline to cover for any secondary bacterial infection, however lungs are clear, patient is afebrile, and in NAD.   Advised to follow-up with PCP for further evaluation and treatment of hemoglobin drop as well as evaluation of abdominal pain, if it continues.  If any of her symptoms are worsening, she is advised to return.  Patient understands and agrees with plan.  Patient vitals stable throughout ED course and discharged in satisfactory condition. I discussed patient case with Dr. Jodi Mourning who guided the patient's management and agrees with plan.  Leah Harvey was evaluated in Emergency Department on 05/05/2018 for the symptoms described in the history of present illness. She was evaluated in the context of the global COVID-19 pandemic, which necessitated consideration that the patient might be at risk for infection with the SARS-CoV-2 virus that causes COVID-19. Institutional protocols and algorithms that pertain to the evaluation of patients at risk for COVID-19 are in a state of rapid change based on information released by regulatory bodies including the CDC and federal and state organizations. These policies and algorithms were followed during the patient's  care in the ED.  Final Clinical Impressions(s) / ED Diagnoses   Final diagnoses:  2019 novel coronavirus disease (COVID-19)  Shortness of breath  Cough  Suprapubic pain    ED Discharge Orders    None           Emi Holes, PA-C 05/07/18 1526    Blane Ohara, MD 05/11/18 2042

## 2018-05-05 NOTE — ED Notes (Signed)
ED Provider at bedside. 

## 2018-05-06 LAB — URINE CULTURE

## 2018-05-16 DIAGNOSIS — E876 Hypokalemia: Secondary | ICD-10-CM | POA: Diagnosis not present

## 2018-05-16 DIAGNOSIS — J189 Pneumonia, unspecified organism: Secondary | ICD-10-CM | POA: Diagnosis not present

## 2018-05-16 DIAGNOSIS — D649 Anemia, unspecified: Secondary | ICD-10-CM | POA: Diagnosis not present

## 2018-05-19 DIAGNOSIS — D649 Anemia, unspecified: Secondary | ICD-10-CM | POA: Diagnosis not present

## 2018-05-19 DIAGNOSIS — R740 Nonspecific elevation of levels of transaminase and lactic acid dehydrogenase [LDH]: Secondary | ICD-10-CM | POA: Diagnosis not present

## 2018-05-30 ENCOUNTER — Other Ambulatory Visit (HOSPITAL_COMMUNITY): Payer: Self-pay | Admitting: Family Medicine

## 2018-05-30 ENCOUNTER — Other Ambulatory Visit: Payer: Self-pay

## 2018-05-30 ENCOUNTER — Ambulatory Visit (HOSPITAL_COMMUNITY)
Admission: RE | Admit: 2018-05-30 | Discharge: 2018-05-30 | Disposition: A | Discharge status: Home / Self Care | Payer: BLUE CROSS/BLUE SHIELD | Source: Ambulatory Visit | Attending: Family Medicine | Admitting: Family Medicine

## 2018-05-30 DIAGNOSIS — J189 Pneumonia, unspecified organism: Secondary | ICD-10-CM | POA: Insufficient documentation

## 2018-05-30 DIAGNOSIS — R918 Other nonspecific abnormal finding of lung field: Secondary | ICD-10-CM | POA: Diagnosis not present

## 2018-06-01 DIAGNOSIS — R0781 Pleurodynia: Secondary | ICD-10-CM | POA: Diagnosis not present

## 2018-06-07 ENCOUNTER — Telehealth: Payer: Self-pay | Admitting: Cardiology

## 2018-06-07 ENCOUNTER — Telehealth: Payer: Self-pay

## 2018-06-07 NOTE — Telephone Encounter (Signed)
Left a detailed message for the to switch her upcoming appointment to a video virtual visit and that she can call our office back and someone will assist her

## 2018-06-07 NOTE — Telephone Encounter (Signed)
Smartphone/ my chart/ consent/ pre reg completed °

## 2018-06-08 ENCOUNTER — Ambulatory Visit: Payer: BLUE CROSS/BLUE SHIELD | Admitting: Cardiology

## 2018-06-08 ENCOUNTER — Telehealth (INDEPENDENT_AMBULATORY_CARE_PROVIDER_SITE_OTHER): Payer: BLUE CROSS/BLUE SHIELD | Admitting: Cardiology

## 2018-06-08 VITALS — Temp 96.5°F | Ht 64.0 in | Wt 160.0 lb

## 2018-06-08 DIAGNOSIS — R002 Palpitations: Secondary | ICD-10-CM

## 2018-06-08 DIAGNOSIS — M546 Pain in thoracic spine: Secondary | ICD-10-CM | POA: Diagnosis not present

## 2018-06-08 DIAGNOSIS — R072 Precordial pain: Secondary | ICD-10-CM

## 2018-06-08 DIAGNOSIS — E7800 Pure hypercholesterolemia, unspecified: Secondary | ICD-10-CM

## 2018-06-08 MED ORDER — METOPROLOL SUCCINATE ER 25 MG PO TB24: ORAL_TABLET | ORAL | 3 refills | Status: DC

## 2018-06-08 NOTE — Progress Notes (Signed)
Virtual Visit via Video Note   This visit type was conducted due to national recommendations for restrictions regarding the COVID-19 Pandemic (e.g. social distancing) in an effort to limit this patient's exposure and mitigate transmission in our community.  Due to her co-morbid illnesses, this patient is at least at moderate risk for complications without adequate follow up.  This format is felt to be most appropriate for this patient at this time.  All issues noted in this document were discussed and addressed.  A limited physical exam was performed with this format.  Please refer to the patient's chart for her consent to telehealth for [facility].   Date:  06/08/2018   ID:  Leah Harvey, DOB [birth year], MRN [id number]  Patient Location: Home Provider Location: Home  PCP:  Laurann MontanaWhite, Cynthia, MD  Cardiologist:  Dr Jens Somrenshaw  Evaluation Performed:  Follow-Up Visit  Chief Complaint:  FU CP and palpitations  History of Present Illness:    FU CP and palpitations.  Patient originally seen in October 2018 with chest pain and palpitations.  Note she is from [place].  Chest CT October 2018 showed no pulmonary embolus.  Echocardiogram October 2018 showed normal LV function.  Monitor November 2018 showed sinus rhythm with PACs, brief PAT and PVCs.  Toprol added.  Stress echocardiogram May 2019 normal.  Had coronavirus in March.  Her symptoms have now improved.  Since that time she denies dyspnea, chest pain, palpitations or syncope.  She has discontinued her Toprol as her blood pressure was running low.  The patient did have symptoms of coronavirus in March and she tested positive.   Past Medical History:  Diagnosis Date  . Hypercholesteremia    Past Surgical History:  Procedure Laterality Date  . No prior surgery       No outpatient medications have been marked as taking for the 06/08/18 encounter (Telemedicine) with Lewayne Buntingrenshaw, Terease Marcotte S, MD.     Allergies:   Patient has no known allergies.    Social History   Tobacco Use  . Smoking status: Never Smoker  . Smokeless tobacco: Never Used  Substance Use Topics  . Alcohol use: No  . Drug use: No     Family Hx: The patient's family history includes Sickle cell anemia in her father.  ROS:   Please see the history of present illness.    No fevers, chills, productive cough. All other systems reviewed and are negative.  Recent Labs: 05/05/2018: ALT 40; BUN 12; Creatinine, Ser 0.71; Hemoglobin 10.6; Magnesium 2.0; Platelets 205; Potassium 3.3; Sodium 138   Recent Lipid Panel Lab Results  Component Value Date/Time   CHOL 183 05/31/2017 08:38 AM   TRIG 46 05/31/2017 08:38 AM   HDL 67 05/31/2017 08:38 AM   CHOLHDL 2.7 05/31/2017 08:38 AM   CHOLHDL 3.4 11/20/2016 07:36 AM   LDLCALC 107 (H) 05/31/2017 08:38 AM    Wt Readings from Last 3 Encounters:  06/08/18 160 lb (72.6 kg)  05/05/18 167 lb 15.9 oz (76.2 kg)  04/30/18 168 lb (76.2 kg)     Objective:    Vital Signs:  Temp (!) 96.5 F (35.8 C)   Ht 5\' 4"  (1.626 m)   Wt 160 lb (72.6 kg)   BMI 27.46 kg/m    VITAL SIGNS:  reviewed  Answers questions appropriately. No acute distress Normal affect Remainder of physical examination not performed (telehealth visit; coronavirus pandemic)  ASSESSMENT & PLAN:    1. Palpitations-patient has not had recurrent symptoms.  Her blood  pressure was running low and she discontinued Toprol and she continues to not have symptoms.  We have changed her Toprol to daily as needed. 2. History of chest pain-previous chest echocardiogram negative.  No recurrent symptoms.  No plans for further evaluation. 3. Hyperlipidemia-continue statin.  Check lipids and liver.  COVID-19 Education: The importance of social distancing was discussed today.  Time:   Today, I have spent 10 minutes with the patient with telehealth technology discussing the above problems.     Medication Adjustments/Labs and Tests Ordered: Current medicines are reviewed  at length with the patient today.  Concerns regarding medicines are outlined above.   Tests Ordered: No orders of the defined types were placed in this encounter.   Medication Changes: No orders of the defined types were placed in this encounter.   Disposition:  Follow up in 1 year(s)  Signed, Olga Millers, MD  06/08/2018 8:45 AM    [facility]

## 2018-06-08 NOTE — Patient Instructions (Signed)
Medication Instructions:  TAKE METOPROLOL DAILY AS NEEDED If you need a refill on your cardiac medications before your next appointment, please call your pharmacy.   Lab work: Your physician recommends that you return for lab work PRIOR TO EATING If you have labs (blood work) drawn today and your tests are completely normal, you will receive your results only by: Marland Kitchen MyChart Message (if you have MyChart) OR . A paper copy in the mail If you have any lab test that is abnormal or we need to change your treatment, we will call you to review the results.  Follow-Up: At [facility], you and your health needs are our priority.  As part of our continuing mission to provide you with exceptional heart care, we have created designated Provider Care Teams.  These Care Teams include your primary Cardiologist (physician) and Advanced Practice Providers (APPs -  Physician Assistants and Nurse Practitioners) who all work together to provide you with the care you need, when you need it. You will need a follow up appointment in 12 months.  Please call our office 2 months in advance to schedule this appointment.  You may see Olga Millers MD or one of the following Advanced Practice Providers on your designated Care Team:   Corine Shelter, PA-C Judy Pimple, [place] . Marjie Skiff, PA-C

## 2018-07-07 DIAGNOSIS — E7800 Pure hypercholesterolemia, unspecified: Secondary | ICD-10-CM | POA: Diagnosis not present

## 2018-07-07 LAB — LIPID PANEL
Chol/HDL Ratio: 2.5 ratio (ref 0.0–4.4)
Cholesterol, Total: 172 mg/dL (ref 100–199)
HDL: 70 mg/dL (ref >39)
LDL Calculated: 90 mg/dL (ref 0–99)
Triglycerides: 59 mg/dL (ref 0–149)
VLDL Cholesterol Cal: 12 mg/dL (ref 5–40)

## 2018-07-07 LAB — HEPATIC FUNCTION PANEL
ALT: 26 IU/L (ref 0–32)
AST: 23 IU/L (ref 0–40)
Albumin: 4.5 g/dL (ref 3.8–4.9)
Alkaline Phosphatase: 56 IU/L (ref 39–117)
Bilirubin Total: 0.5 mg/dL (ref 0.0–1.2)
Bilirubin, Direct: 0.14 mg/dL (ref 0.00–0.40)
Total Protein: 6.8 g/dL (ref 6.0–8.5)

## 2018-07-08 ENCOUNTER — Encounter: Payer: Self-pay | Admitting: *Deleted

## 2018-10-13 DIAGNOSIS — R220 Localized swelling, mass and lump, head: Secondary | ICD-10-CM | POA: Diagnosis not present

## 2018-10-20 DIAGNOSIS — R1013 Epigastric pain: Secondary | ICD-10-CM | POA: Diagnosis not present

## 2018-11-10 DIAGNOSIS — R1013 Epigastric pain: Secondary | ICD-10-CM | POA: Diagnosis not present

## 2018-11-10 DIAGNOSIS — Z87898 Personal history of other specified conditions: Secondary | ICD-10-CM | POA: Diagnosis not present

## 2018-11-11 DIAGNOSIS — N281 Cyst of kidney, acquired: Secondary | ICD-10-CM | POA: Diagnosis not present

## 2018-11-11 DIAGNOSIS — K760 Fatty (change of) liver, not elsewhere classified: Secondary | ICD-10-CM | POA: Diagnosis not present

## 2018-11-11 DIAGNOSIS — N200 Calculus of kidney: Secondary | ICD-10-CM | POA: Diagnosis not present

## 2018-11-24 ENCOUNTER — Other Ambulatory Visit: Payer: Self-pay | Admitting: Gastroenterology

## 2018-11-24 DIAGNOSIS — R1010 Upper abdominal pain, unspecified: Secondary | ICD-10-CM

## 2018-11-28 DIAGNOSIS — J3089 Other allergic rhinitis: Secondary | ICD-10-CM | POA: Diagnosis not present

## 2018-11-28 DIAGNOSIS — J3081 Allergic rhinitis due to animal (cat) (dog) hair and dander: Secondary | ICD-10-CM | POA: Diagnosis not present

## 2018-11-28 DIAGNOSIS — J301 Allergic rhinitis due to pollen: Secondary | ICD-10-CM | POA: Diagnosis not present

## 2018-11-28 DIAGNOSIS — T783XXD Angioneurotic edema, subsequent encounter: Secondary | ICD-10-CM | POA: Diagnosis not present

## 2018-11-30 ENCOUNTER — Ambulatory Visit
Admission: RE | Admit: 2018-11-30 | Discharge: 2018-11-30 | Disposition: A | Discharge status: Home / Self Care | Payer: BLUE CROSS/BLUE SHIELD | Source: Ambulatory Visit | Attending: Gastroenterology | Admitting: Gastroenterology

## 2018-11-30 DIAGNOSIS — N281 Cyst of kidney, acquired: Secondary | ICD-10-CM | POA: Diagnosis not present

## 2018-11-30 DIAGNOSIS — K5730 Diverticulosis of large intestine without perforation or abscess without bleeding: Secondary | ICD-10-CM | POA: Diagnosis not present

## 2018-11-30 DIAGNOSIS — R1010 Upper abdominal pain, unspecified: Secondary | ICD-10-CM

## 2018-11-30 DIAGNOSIS — D259 Leiomyoma of uterus, unspecified: Secondary | ICD-10-CM | POA: Diagnosis not present

## 2018-11-30 MED ORDER — IOPAMIDOL (ISOVUE-300) INJECTION 61%
100.0000 mL | Freq: Once | INTRAVENOUS | Status: AC | PRN
  Administered 2018-11-30: 100 mL via INTRAVENOUS

## 2018-12-02 DIAGNOSIS — E785 Hyperlipidemia, unspecified: Secondary | ICD-10-CM | POA: Diagnosis not present

## 2018-12-02 DIAGNOSIS — E2839 Other primary ovarian failure: Secondary | ICD-10-CM | POA: Diagnosis not present

## 2018-12-02 DIAGNOSIS — R1013 Epigastric pain: Secondary | ICD-10-CM | POA: Diagnosis not present

## 2018-12-02 DIAGNOSIS — E559 Vitamin D deficiency, unspecified: Secondary | ICD-10-CM | POA: Diagnosis not present

## 2018-12-02 DIAGNOSIS — Z0000 Encounter for general adult medical examination without abnormal findings: Secondary | ICD-10-CM | POA: Diagnosis not present

## 2018-12-02 DIAGNOSIS — Z23 Encounter for immunization: Secondary | ICD-10-CM | POA: Diagnosis not present

## 2019-01-18 DIAGNOSIS — M546 Pain in thoracic spine: Secondary | ICD-10-CM | POA: Diagnosis not present

## 2019-01-18 DIAGNOSIS — R109 Unspecified abdominal pain: Secondary | ICD-10-CM | POA: Diagnosis not present

## 2019-01-24 DIAGNOSIS — M546 Pain in thoracic spine: Secondary | ICD-10-CM | POA: Diagnosis not present

## 2019-01-31 DIAGNOSIS — M546 Pain in thoracic spine: Secondary | ICD-10-CM | POA: Diagnosis not present

## 2019-02-01 DIAGNOSIS — Z23 Encounter for immunization: Secondary | ICD-10-CM | POA: Diagnosis not present

## 2019-02-23 DIAGNOSIS — M6281 Muscle weakness (generalized): Secondary | ICD-10-CM | POA: Diagnosis not present

## 2019-02-23 DIAGNOSIS — M546 Pain in thoracic spine: Secondary | ICD-10-CM | POA: Diagnosis not present

## 2019-03-01 DIAGNOSIS — M810 Age-related osteoporosis without current pathological fracture: Secondary | ICD-10-CM | POA: Diagnosis not present

## 2019-03-01 DIAGNOSIS — M85852 Other specified disorders of bone density and structure, left thigh: Secondary | ICD-10-CM | POA: Diagnosis not present

## 2019-03-01 DIAGNOSIS — M85851 Other specified disorders of bone density and structure, right thigh: Secondary | ICD-10-CM | POA: Diagnosis not present

## 2019-03-01 DIAGNOSIS — Z1231 Encounter for screening mammogram for malignant neoplasm of breast: Secondary | ICD-10-CM | POA: Diagnosis not present

## 2019-03-01 DIAGNOSIS — Z780 Asymptomatic menopausal state: Secondary | ICD-10-CM | POA: Diagnosis not present

## 2019-03-08 DIAGNOSIS — Z01419 Encounter for gynecological examination (general) (routine) without abnormal findings: Secondary | ICD-10-CM | POA: Diagnosis not present

## 2019-03-14 DIAGNOSIS — M810 Age-related osteoporosis without current pathological fracture: Secondary | ICD-10-CM | POA: Diagnosis not present

## 2019-04-05 DIAGNOSIS — J301 Allergic rhinitis due to pollen: Secondary | ICD-10-CM | POA: Diagnosis not present

## 2019-04-05 DIAGNOSIS — J3089 Other allergic rhinitis: Secondary | ICD-10-CM | POA: Diagnosis not present

## 2019-04-05 DIAGNOSIS — J3081 Allergic rhinitis due to animal (cat) (dog) hair and dander: Secondary | ICD-10-CM | POA: Diagnosis not present

## 2019-04-05 DIAGNOSIS — T783XXD Angioneurotic edema, subsequent encounter: Secondary | ICD-10-CM | POA: Diagnosis not present

## 2019-05-10 DIAGNOSIS — M546 Pain in thoracic spine: Secondary | ICD-10-CM | POA: Diagnosis not present

## 2019-05-10 DIAGNOSIS — G8929 Other chronic pain: Secondary | ICD-10-CM | POA: Diagnosis not present

## 2019-05-15 DIAGNOSIS — L235 Allergic contact dermatitis due to other chemical products: Secondary | ICD-10-CM | POA: Diagnosis not present

## 2019-05-28 DIAGNOSIS — W1839XA Other fall on same level, initial encounter: Secondary | ICD-10-CM | POA: Diagnosis not present

## 2019-05-28 DIAGNOSIS — S79912A Unspecified injury of left hip, initial encounter: Secondary | ICD-10-CM | POA: Diagnosis not present

## 2019-05-28 DIAGNOSIS — R0781 Pleurodynia: Secondary | ICD-10-CM | POA: Diagnosis not present

## 2019-05-28 DIAGNOSIS — M79605 Pain in left leg: Secondary | ICD-10-CM | POA: Diagnosis not present

## 2019-05-28 DIAGNOSIS — M545 Low back pain: Secondary | ICD-10-CM | POA: Diagnosis not present

## 2019-05-28 DIAGNOSIS — Y999 Unspecified external cause status: Secondary | ICD-10-CM | POA: Diagnosis not present

## 2019-05-28 DIAGNOSIS — M25552 Pain in left hip: Secondary | ICD-10-CM | POA: Diagnosis not present

## 2019-05-28 DIAGNOSIS — M7918 Myalgia, other site: Secondary | ICD-10-CM | POA: Diagnosis not present

## 2019-06-14 NOTE — Progress Notes (Signed)
Virtual Visit via Video Note   This visit type was conducted due to national recommendations for restrictions regarding the COVID-19 Pandemic (e.g. social distancing) in an effort to limit this patient's exposure and mitigate transmission in our community.  Due to her co-morbid illnesses, this patient is at least at moderate risk for complications without adequate follow up.  This format is felt to be most appropriate for this patient at this time.  All issues noted in this document were discussed and addressed.  A limited physical exam was performed with this format.  Please refer to the patient's chart for her consent to telehealth for [facility].   Date:  06/14/2019   ID:  Leah Harvey, DOB September 15, 1960, MRN [id number]  Patient Location: Home Provider Location: Home  PCP:  Laurann Montana, MD  Cardiologist:  Dr.Crenshaw  Electrophysiologist:  None   Evaluation Performed:  Follow-Up Visit  Chief Complaint:  Follow up  History of Present Illness:    Leah Harvey is a 59 y.o. female with we are following for ongoing assessment and management of chest pain and palpitations.  The patient is from [place].  Echocardiogram in 2018 revealed normal LV systolic function.  Stress echocardiogram in May 2019 was normal.  It was noted that she did have coronavirus in March 2020.  She was last seen on 06/08/2018 by Dr. Jens Som.  At that time she was asymptomatic no new testing.  Metoprolol was changed to daily as needed.  Fasting lipids and LFTs were ordered.  Labs on 07/07/2018 revealed HDL of 20 total cholesterol 172 LDL of 90 triglycerides of 59.  LFTs were within normal limits.  Unfortunately, the patient was seen in the ED on 05/05/2018, with complaints of a 2-week history of cough and shortness of breath.  She was afebrile.  She was found to be having a history of coronavirus her positive COVID per [facility] medical record.  She was not in acute distress.  Labs were essentially normal.  She did have  suprapubic tenderness with UTI ruled out.  She was to follow-up with PCP for further evaluation of abdominal pain and symptoms.  She is no longer taking the metoprolol. She is not having any heart racing, chest pain or DOE. This was given during the time she had COVID. Once she recovered she no longer needed it. In fact, it made her BP too low.   The patient does not have symptoms concerning for COVID-19 infection (fever, chills, cough, or new shortness of breath).    Past Medical History:  Diagnosis Date  . Hypercholesteremia    Past Surgical History:  Procedure Laterality Date  . No prior surgery       No outpatient medications have been marked as taking for the 06/15/19 encounter (Appointment) with Jodelle Gross, NP.     Allergies:   Patient has no known allergies.   Social History   Tobacco Use  . Smoking status: Never Smoker  . Smokeless tobacco: Never Used  Substance Use Topics  . Alcohol use: No  . Drug use: No     Family Hx: The patient's family history includes Sickle cell anemia in her father.  ROS:   Please see the history of present illness.    All other systems reviewed and are negative.   Prior CV studies:   The following studies were reviewed today: Stress Echo 06/08/2017  Stress ECG conclusions: There were no stress arrhythmias or  conduction abnormalities. The stress ECG was normal. [facility]  scoring: exercise time of 7 min; maximum ST deviation of 0 mm; no  angina; resulting score is 7. This score predicts a low risk of  cardiac events.  - Staged echo: There was no echocardiographic evidence for  stress-induced ischemia.  Labs/Other Tests and Data Reviewed:    EKG:  No ECG reviewed.  Recent Labs: 07/07/2018: ALT 26   Recent Lipid Panel Lab Results  Component Value Date/Time   CHOL 172 07/07/2018 09:25 AM   TRIG 59 07/07/2018 09:25 AM   HDL 70 07/07/2018 09:25 AM   CHOLHDL 2.5 07/07/2018 09:25 AM   CHOLHDL 3.4 11/20/2016 07:36 AM     LDLCALC 90 07/07/2018 09:25 AM    Wt Readings from Last 3 Encounters:  06/08/18 160 lb (72.6 kg)  05/05/18 167 lb 15.9 oz (76.2 kg)  04/30/18 168 lb (76.2 kg)     Objective:    Vital Signs:  There were no vitals taken for this visit.   VITAL SIGNS:  reviewed GEN:  no acute distress EYES:  sclerae anicteric, EOMI - Extraocular Movements Intact RESPIRATORY:  normal respiratory effort, symmetric expansion MUSCULOSKELETAL:  no obvious deformities. NEURO:  alert and oriented x 3, no obvious focal deficit PSYCH:  normal affect  ASSESSMENT & PLAN:    1.  Hypertension: BP has been well controlled. Not on any antihypertensive medications at this time. Followed by PCP  2. Hyperlipidemia: On pravastatin. Labs followed by PCP  COVID-19 Education: The signs and symptoms of COVID-19 were discussed with the patient and how to seek care for testing (follow up with PCP or arrange E-visit). The importance of social distancing was discussed today.  Time:   Today, I have spent 15 minutes with the patient with telehealth technology discussing the above problems.     Medication Adjustments/Labs and Tests Ordered: Current medicines are reviewed at length with the patient today.  Concerns regarding medicines are outlined above.   Tests Ordered: No orders of the defined types were placed in this encounter.   Medication Changes: No orders of the defined types were placed in this encounter.   Disposition:  Follow up 1 year or prn.  Signed, Phill Myron. [place] Pugh, ANP, [facility]  06/14/2019 10:56 AM    [facility]

## 2019-06-15 ENCOUNTER — Telehealth (INDEPENDENT_AMBULATORY_CARE_PROVIDER_SITE_OTHER): Payer: BC Managed Care – PPO | Admitting: Adult Health

## 2019-06-15 ENCOUNTER — Encounter: Payer: Self-pay | Admitting: Adult Health

## 2019-06-15 VITALS — Ht 64.0 in | Wt 180.0 lb

## 2019-06-15 DIAGNOSIS — E7800 Pure hypercholesterolemia, unspecified: Secondary | ICD-10-CM | POA: Diagnosis not present

## 2019-06-15 DIAGNOSIS — I10 Essential (primary) hypertension: Secondary | ICD-10-CM

## 2019-06-15 NOTE — Patient Instructions (Signed)
Medication Instructions:  Your physician recommends that you continue on your current medications as directed. Please refer to the Current Medication list given to you today.  *If you need a refill on your cardiac medications before your next appointment, please call your pharmacy*   Follow-Up: At [facility], you and your health needs are our priority.  As part of our continuing mission to provide you with exceptional heart care, we have created designated Provider Care Teams.  These Care Teams include your primary Cardiologist (physician) and Advanced Practice Providers (APPs -  Physician Assistants and Nurse Practitioners) who all work together to provide you with the care you need, when you need it.  We recommend signing up for the patient portal called "MyChart".  Sign up information is provided on this After Visit Summary.  MyChart is used to connect with patients for Virtual Visits (Telemedicine).  Patients are able to view lab/test results, encounter notes, upcoming appointments, etc.  Non-urgent messages can be sent to your provider as well.   To learn more about what you can do with MyChart, go to [email or web address].    Your next appointment:   12 month(s)  The format for your next appointment:   In Person  Provider:   You may see Olga Millers, MD or one of the following Advanced Practice Providers on your designated Care Team:    Corine Shelter, PA-C  [place], [place]  Edd Fabian, [place]    Other Instructions Please call our office 2 months in advance to schedule your annual follow-up appointment with Dr. Jens Som.

## 2019-06-21 DIAGNOSIS — S39012D Strain of muscle, fascia and tendon of lower back, subsequent encounter: Secondary | ICD-10-CM | POA: Diagnosis not present

## 2019-06-21 DIAGNOSIS — R0781 Pleurodynia: Secondary | ICD-10-CM | POA: Diagnosis not present

## 2019-06-26 DIAGNOSIS — M545 Low back pain: Secondary | ICD-10-CM | POA: Diagnosis not present

## 2019-06-26 DIAGNOSIS — M546 Pain in thoracic spine: Secondary | ICD-10-CM | POA: Diagnosis not present

## 2019-06-26 DIAGNOSIS — M6281 Muscle weakness (generalized): Secondary | ICD-10-CM | POA: Diagnosis not present

## 2019-06-26 DIAGNOSIS — M2560 Stiffness of unspecified joint, not elsewhere classified: Secondary | ICD-10-CM | POA: Diagnosis not present

## 2019-06-29 DIAGNOSIS — M546 Pain in thoracic spine: Secondary | ICD-10-CM | POA: Diagnosis not present

## 2019-06-29 DIAGNOSIS — M2560 Stiffness of unspecified joint, not elsewhere classified: Secondary | ICD-10-CM | POA: Diagnosis not present

## 2019-06-29 DIAGNOSIS — M545 Low back pain: Secondary | ICD-10-CM | POA: Diagnosis not present

## 2019-06-29 DIAGNOSIS — M6281 Muscle weakness (generalized): Secondary | ICD-10-CM | POA: Diagnosis not present

## 2019-07-04 DIAGNOSIS — M545 Low back pain: Secondary | ICD-10-CM | POA: Diagnosis not present

## 2019-07-04 DIAGNOSIS — M546 Pain in thoracic spine: Secondary | ICD-10-CM | POA: Diagnosis not present

## 2019-07-04 DIAGNOSIS — M2560 Stiffness of unspecified joint, not elsewhere classified: Secondary | ICD-10-CM | POA: Diagnosis not present

## 2019-07-04 DIAGNOSIS — M6281 Muscle weakness (generalized): Secondary | ICD-10-CM | POA: Diagnosis not present

## 2019-07-06 DIAGNOSIS — M2560 Stiffness of unspecified joint, not elsewhere classified: Secondary | ICD-10-CM | POA: Diagnosis not present

## 2019-07-06 DIAGNOSIS — M6281 Muscle weakness (generalized): Secondary | ICD-10-CM | POA: Diagnosis not present

## 2019-07-06 DIAGNOSIS — M545 Low back pain: Secondary | ICD-10-CM | POA: Diagnosis not present

## 2019-07-06 DIAGNOSIS — M546 Pain in thoracic spine: Secondary | ICD-10-CM | POA: Diagnosis not present

## 2019-07-11 DIAGNOSIS — M6281 Muscle weakness (generalized): Secondary | ICD-10-CM | POA: Diagnosis not present

## 2019-07-11 DIAGNOSIS — M545 Low back pain: Secondary | ICD-10-CM | POA: Diagnosis not present

## 2019-07-11 DIAGNOSIS — M2560 Stiffness of unspecified joint, not elsewhere classified: Secondary | ICD-10-CM | POA: Diagnosis not present

## 2019-07-11 DIAGNOSIS — M546 Pain in thoracic spine: Secondary | ICD-10-CM | POA: Diagnosis not present

## 2019-07-13 DIAGNOSIS — M545 Low back pain: Secondary | ICD-10-CM | POA: Diagnosis not present

## 2019-07-13 DIAGNOSIS — M546 Pain in thoracic spine: Secondary | ICD-10-CM | POA: Diagnosis not present

## 2019-07-13 DIAGNOSIS — M6281 Muscle weakness (generalized): Secondary | ICD-10-CM | POA: Diagnosis not present

## 2019-07-13 DIAGNOSIS — M2560 Stiffness of unspecified joint, not elsewhere classified: Secondary | ICD-10-CM | POA: Diagnosis not present

## 2019-07-18 DIAGNOSIS — M2560 Stiffness of unspecified joint, not elsewhere classified: Secondary | ICD-10-CM | POA: Diagnosis not present

## 2019-07-18 DIAGNOSIS — M545 Low back pain: Secondary | ICD-10-CM | POA: Diagnosis not present

## 2019-07-18 DIAGNOSIS — M546 Pain in thoracic spine: Secondary | ICD-10-CM | POA: Diagnosis not present

## 2019-07-18 DIAGNOSIS — M6281 Muscle weakness (generalized): Secondary | ICD-10-CM | POA: Diagnosis not present

## 2019-07-20 DIAGNOSIS — M545 Low back pain: Secondary | ICD-10-CM | POA: Diagnosis not present

## 2019-07-20 DIAGNOSIS — M546 Pain in thoracic spine: Secondary | ICD-10-CM | POA: Diagnosis not present

## 2019-07-20 DIAGNOSIS — M6281 Muscle weakness (generalized): Secondary | ICD-10-CM | POA: Diagnosis not present

## 2019-07-20 DIAGNOSIS — M2560 Stiffness of unspecified joint, not elsewhere classified: Secondary | ICD-10-CM | POA: Diagnosis not present

## 2019-07-24 DIAGNOSIS — R0781 Pleurodynia: Secondary | ICD-10-CM | POA: Diagnosis not present

## 2019-07-24 DIAGNOSIS — M545 Low back pain: Secondary | ICD-10-CM | POA: Diagnosis not present

## 2019-08-25 DIAGNOSIS — M546 Pain in thoracic spine: Secondary | ICD-10-CM | POA: Diagnosis not present

## 2019-08-25 DIAGNOSIS — M545 Low back pain: Secondary | ICD-10-CM | POA: Diagnosis not present

## 2019-09-01 DIAGNOSIS — M533 Sacrococcygeal disorders, not elsewhere classified: Secondary | ICD-10-CM | POA: Diagnosis not present

## 2019-09-01 DIAGNOSIS — M545 Low back pain: Secondary | ICD-10-CM | POA: Diagnosis not present

## 2019-09-08 DIAGNOSIS — M545 Low back pain: Secondary | ICD-10-CM | POA: Diagnosis not present

## 2019-09-20 DIAGNOSIS — Z7184 Encounter for health counseling related to travel: Secondary | ICD-10-CM | POA: Diagnosis not present

## 2019-09-20 DIAGNOSIS — R55 Syncope and collapse: Secondary | ICD-10-CM | POA: Diagnosis not present

## 2019-09-26 DIAGNOSIS — M533 Sacrococcygeal disorders, not elsewhere classified: Secondary | ICD-10-CM | POA: Diagnosis not present

## 2019-10-03 DIAGNOSIS — M5416 Radiculopathy, lumbar region: Secondary | ICD-10-CM | POA: Diagnosis not present

## 2019-10-03 DIAGNOSIS — M6281 Muscle weakness (generalized): Secondary | ICD-10-CM | POA: Diagnosis not present

## 2019-10-03 DIAGNOSIS — M545 Low back pain: Secondary | ICD-10-CM | POA: Diagnosis not present

## 2019-10-03 DIAGNOSIS — M25552 Pain in left hip: Secondary | ICD-10-CM | POA: Diagnosis not present

## 2019-10-12 ENCOUNTER — Other Ambulatory Visit: Payer: Self-pay | Admitting: Orthopedic Surgery

## 2019-10-12 DIAGNOSIS — M533 Sacrococcygeal disorders, not elsewhere classified: Secondary | ICD-10-CM

## 2019-10-13 DIAGNOSIS — J3081 Allergic rhinitis due to animal (cat) (dog) hair and dander: Secondary | ICD-10-CM | POA: Diagnosis not present

## 2019-10-13 DIAGNOSIS — J301 Allergic rhinitis due to pollen: Secondary | ICD-10-CM | POA: Diagnosis not present

## 2019-10-13 DIAGNOSIS — T783XXD Angioneurotic edema, subsequent encounter: Secondary | ICD-10-CM | POA: Diagnosis not present

## 2019-10-13 DIAGNOSIS — J3089 Other allergic rhinitis: Secondary | ICD-10-CM | POA: Diagnosis not present

## 2019-10-17 DIAGNOSIS — M6281 Muscle weakness (generalized): Secondary | ICD-10-CM | POA: Diagnosis not present

## 2019-10-17 DIAGNOSIS — M545 Low back pain: Secondary | ICD-10-CM | POA: Diagnosis not present

## 2019-10-17 DIAGNOSIS — M5416 Radiculopathy, lumbar region: Secondary | ICD-10-CM | POA: Diagnosis not present

## 2019-10-17 DIAGNOSIS — M25552 Pain in left hip: Secondary | ICD-10-CM | POA: Diagnosis not present

## 2019-10-19 DIAGNOSIS — M6281 Muscle weakness (generalized): Secondary | ICD-10-CM | POA: Diagnosis not present

## 2019-10-19 DIAGNOSIS — M545 Low back pain: Secondary | ICD-10-CM | POA: Diagnosis not present

## 2019-10-19 DIAGNOSIS — M5416 Radiculopathy, lumbar region: Secondary | ICD-10-CM | POA: Diagnosis not present

## 2019-10-19 DIAGNOSIS — M25552 Pain in left hip: Secondary | ICD-10-CM | POA: Diagnosis not present

## 2019-10-20 ENCOUNTER — Other Ambulatory Visit: Payer: Self-pay

## 2019-10-20 ENCOUNTER — Ambulatory Visit
Admission: RE | Admit: 2019-10-20 | Discharge: 2019-10-20 | Disposition: A | Discharge status: Home / Self Care | Payer: BC Managed Care – PPO | Source: Ambulatory Visit | Attending: Orthopedic Surgery | Admitting: Orthopedic Surgery

## 2019-10-20 DIAGNOSIS — M533 Sacrococcygeal disorders, not elsewhere classified: Secondary | ICD-10-CM

## 2019-10-25 DIAGNOSIS — M5416 Radiculopathy, lumbar region: Secondary | ICD-10-CM | POA: Diagnosis not present

## 2019-10-25 DIAGNOSIS — M6281 Muscle weakness (generalized): Secondary | ICD-10-CM | POA: Diagnosis not present

## 2019-10-25 DIAGNOSIS — M545 Low back pain: Secondary | ICD-10-CM | POA: Diagnosis not present

## 2019-10-25 DIAGNOSIS — M25552 Pain in left hip: Secondary | ICD-10-CM | POA: Diagnosis not present

## 2019-10-27 DIAGNOSIS — M545 Low back pain: Secondary | ICD-10-CM | POA: Diagnosis not present

## 2019-10-28 DIAGNOSIS — Z20822 Contact with and (suspected) exposure to covid-19: Secondary | ICD-10-CM | POA: Diagnosis not present

## 2019-11-26 DIAGNOSIS — N390 Urinary tract infection, site not specified: Secondary | ICD-10-CM | POA: Diagnosis not present

## 2019-11-26 DIAGNOSIS — B9620 Unspecified Escherichia coli [E. coli] as the cause of diseases classified elsewhere: Secondary | ICD-10-CM | POA: Diagnosis not present

## 2019-11-28 DIAGNOSIS — R262 Difficulty in walking, not elsewhere classified: Secondary | ICD-10-CM | POA: Diagnosis not present

## 2019-11-28 DIAGNOSIS — M6281 Muscle weakness (generalized): Secondary | ICD-10-CM | POA: Diagnosis not present

## 2019-11-28 DIAGNOSIS — M25552 Pain in left hip: Secondary | ICD-10-CM | POA: Diagnosis not present

## 2019-11-28 DIAGNOSIS — M5416 Radiculopathy, lumbar region: Secondary | ICD-10-CM | POA: Diagnosis not present

## 2019-11-30 DIAGNOSIS — M25552 Pain in left hip: Secondary | ICD-10-CM | POA: Diagnosis not present

## 2019-11-30 DIAGNOSIS — M6281 Muscle weakness (generalized): Secondary | ICD-10-CM | POA: Diagnosis not present

## 2019-11-30 DIAGNOSIS — M5416 Radiculopathy, lumbar region: Secondary | ICD-10-CM | POA: Diagnosis not present

## 2019-11-30 DIAGNOSIS — R262 Difficulty in walking, not elsewhere classified: Secondary | ICD-10-CM | POA: Diagnosis not present

## 2019-12-05 DIAGNOSIS — M5416 Radiculopathy, lumbar region: Secondary | ICD-10-CM | POA: Diagnosis not present

## 2019-12-05 DIAGNOSIS — M25552 Pain in left hip: Secondary | ICD-10-CM | POA: Diagnosis not present

## 2019-12-05 DIAGNOSIS — R262 Difficulty in walking, not elsewhere classified: Secondary | ICD-10-CM | POA: Diagnosis not present

## 2019-12-05 DIAGNOSIS — M6281 Muscle weakness (generalized): Secondary | ICD-10-CM | POA: Diagnosis not present

## 2019-12-06 DIAGNOSIS — M6281 Muscle weakness (generalized): Secondary | ICD-10-CM | POA: Diagnosis not present

## 2019-12-06 DIAGNOSIS — R262 Difficulty in walking, not elsewhere classified: Secondary | ICD-10-CM | POA: Diagnosis not present

## 2019-12-06 DIAGNOSIS — M5416 Radiculopathy, lumbar region: Secondary | ICD-10-CM | POA: Diagnosis not present

## 2019-12-06 DIAGNOSIS — M25552 Pain in left hip: Secondary | ICD-10-CM | POA: Diagnosis not present

## 2019-12-07 DIAGNOSIS — E785 Hyperlipidemia, unspecified: Secondary | ICD-10-CM | POA: Diagnosis not present

## 2019-12-07 DIAGNOSIS — M810 Age-related osteoporosis without current pathological fracture: Secondary | ICD-10-CM | POA: Diagnosis not present

## 2019-12-07 DIAGNOSIS — R300 Dysuria: Secondary | ICD-10-CM | POA: Diagnosis not present

## 2019-12-07 DIAGNOSIS — E559 Vitamin D deficiency, unspecified: Secondary | ICD-10-CM | POA: Diagnosis not present

## 2019-12-07 DIAGNOSIS — Z23 Encounter for immunization: Secondary | ICD-10-CM | POA: Diagnosis not present

## 2019-12-07 DIAGNOSIS — Z0000 Encounter for general adult medical examination without abnormal findings: Secondary | ICD-10-CM | POA: Diagnosis not present

## 2019-12-12 DIAGNOSIS — M6281 Muscle weakness (generalized): Secondary | ICD-10-CM | POA: Diagnosis not present

## 2019-12-12 DIAGNOSIS — M25552 Pain in left hip: Secondary | ICD-10-CM | POA: Diagnosis not present

## 2019-12-12 DIAGNOSIS — R262 Difficulty in walking, not elsewhere classified: Secondary | ICD-10-CM | POA: Diagnosis not present

## 2019-12-12 DIAGNOSIS — M5416 Radiculopathy, lumbar region: Secondary | ICD-10-CM | POA: Diagnosis not present

## 2019-12-13 DIAGNOSIS — R262 Difficulty in walking, not elsewhere classified: Secondary | ICD-10-CM | POA: Diagnosis not present

## 2019-12-13 DIAGNOSIS — M6281 Muscle weakness (generalized): Secondary | ICD-10-CM | POA: Diagnosis not present

## 2019-12-13 DIAGNOSIS — M25552 Pain in left hip: Secondary | ICD-10-CM | POA: Diagnosis not present

## 2019-12-13 DIAGNOSIS — M5416 Radiculopathy, lumbar region: Secondary | ICD-10-CM | POA: Diagnosis not present

## 2019-12-20 DIAGNOSIS — M6281 Muscle weakness (generalized): Secondary | ICD-10-CM | POA: Diagnosis not present

## 2019-12-20 DIAGNOSIS — M5416 Radiculopathy, lumbar region: Secondary | ICD-10-CM | POA: Diagnosis not present

## 2019-12-20 DIAGNOSIS — R262 Difficulty in walking, not elsewhere classified: Secondary | ICD-10-CM | POA: Diagnosis not present

## 2019-12-20 DIAGNOSIS — M25552 Pain in left hip: Secondary | ICD-10-CM | POA: Diagnosis not present

## 2019-12-21 DIAGNOSIS — M6281 Muscle weakness (generalized): Secondary | ICD-10-CM | POA: Diagnosis not present

## 2019-12-21 DIAGNOSIS — M5416 Radiculopathy, lumbar region: Secondary | ICD-10-CM | POA: Diagnosis not present

## 2019-12-21 DIAGNOSIS — M25552 Pain in left hip: Secondary | ICD-10-CM | POA: Diagnosis not present

## 2019-12-21 DIAGNOSIS — R262 Difficulty in walking, not elsewhere classified: Secondary | ICD-10-CM | POA: Diagnosis not present

## 2020-01-02 DIAGNOSIS — H61892 Other specified disorders of left external ear: Secondary | ICD-10-CM | POA: Diagnosis not present

## 2020-01-02 DIAGNOSIS — J343 Hypertrophy of nasal turbinates: Secondary | ICD-10-CM | POA: Diagnosis not present

## 2020-01-02 DIAGNOSIS — L720 Epidermal cyst: Secondary | ICD-10-CM | POA: Diagnosis not present

## 2020-01-02 DIAGNOSIS — H6122 Impacted cerumen, left ear: Secondary | ICD-10-CM | POA: Diagnosis not present

## 2020-01-09 DIAGNOSIS — L720 Epidermal cyst: Secondary | ICD-10-CM | POA: Diagnosis not present

## 2020-01-09 DIAGNOSIS — H61892 Other specified disorders of left external ear: Secondary | ICD-10-CM | POA: Diagnosis not present

## 2020-03-06 DIAGNOSIS — Z1231 Encounter for screening mammogram for malignant neoplasm of breast: Secondary | ICD-10-CM | POA: Diagnosis not present

## 2020-03-12 DIAGNOSIS — Z01419 Encounter for gynecological examination (general) (routine) without abnormal findings: Secondary | ICD-10-CM | POA: Diagnosis not present

## 2020-04-08 DIAGNOSIS — Z01812 Encounter for preprocedural laboratory examination: Secondary | ICD-10-CM | POA: Diagnosis not present

## 2020-04-11 DIAGNOSIS — K5730 Diverticulosis of large intestine without perforation or abscess without bleeding: Secondary | ICD-10-CM | POA: Diagnosis not present

## 2020-04-11 DIAGNOSIS — Z1211 Encounter for screening for malignant neoplasm of colon: Secondary | ICD-10-CM | POA: Diagnosis not present

## 2020-07-12 DIAGNOSIS — M549 Dorsalgia, unspecified: Secondary | ICD-10-CM | POA: Diagnosis not present

## 2020-07-12 DIAGNOSIS — M545 Low back pain, unspecified: Secondary | ICD-10-CM | POA: Diagnosis not present

## 2020-07-12 DIAGNOSIS — M47817 Spondylosis without myelopathy or radiculopathy, lumbosacral region: Secondary | ICD-10-CM | POA: Diagnosis not present

## 2020-07-12 DIAGNOSIS — G8929 Other chronic pain: Secondary | ICD-10-CM | POA: Diagnosis not present

## 2020-07-12 DIAGNOSIS — M47816 Spondylosis without myelopathy or radiculopathy, lumbar region: Secondary | ICD-10-CM | POA: Diagnosis not present

## 2020-07-31 DIAGNOSIS — G8929 Other chronic pain: Secondary | ICD-10-CM | POA: Diagnosis not present

## 2020-07-31 DIAGNOSIS — M545 Low back pain, unspecified: Secondary | ICD-10-CM | POA: Diagnosis not present

## 2020-07-31 DIAGNOSIS — M546 Pain in thoracic spine: Secondary | ICD-10-CM | POA: Diagnosis not present

## 2020-08-12 DIAGNOSIS — G8929 Other chronic pain: Secondary | ICD-10-CM | POA: Diagnosis not present

## 2020-08-12 DIAGNOSIS — M545 Low back pain, unspecified: Secondary | ICD-10-CM | POA: Diagnosis not present

## 2020-09-26 DIAGNOSIS — H2513 Age-related nuclear cataract, bilateral: Secondary | ICD-10-CM | POA: Diagnosis not present

## 2020-09-26 DIAGNOSIS — H40023 Open angle with borderline findings, high risk, bilateral: Secondary | ICD-10-CM | POA: Diagnosis not present

## 2020-09-26 DIAGNOSIS — H04123 Dry eye syndrome of bilateral lacrimal glands: Secondary | ICD-10-CM | POA: Diagnosis not present

## 2020-12-19 DIAGNOSIS — E559 Vitamin D deficiency, unspecified: Secondary | ICD-10-CM | POA: Diagnosis not present

## 2020-12-19 DIAGNOSIS — Z0000 Encounter for general adult medical examination without abnormal findings: Secondary | ICD-10-CM | POA: Diagnosis not present

## 2020-12-19 DIAGNOSIS — E785 Hyperlipidemia, unspecified: Secondary | ICD-10-CM | POA: Diagnosis not present

## 2020-12-19 DIAGNOSIS — Z23 Encounter for immunization: Secondary | ICD-10-CM | POA: Diagnosis not present

## 2020-12-19 DIAGNOSIS — M810 Age-related osteoporosis without current pathological fracture: Secondary | ICD-10-CM | POA: Diagnosis not present

## 2020-12-19 DIAGNOSIS — R079 Chest pain, unspecified: Secondary | ICD-10-CM | POA: Diagnosis not present

## 2020-12-20 ENCOUNTER — Other Ambulatory Visit: Payer: Self-pay | Admitting: Family Medicine

## 2020-12-20 DIAGNOSIS — R079 Chest pain, unspecified: Secondary | ICD-10-CM

## 2020-12-23 DIAGNOSIS — J3089 Other allergic rhinitis: Secondary | ICD-10-CM | POA: Diagnosis not present

## 2020-12-23 DIAGNOSIS — T783XXD Angioneurotic edema, subsequent encounter: Secondary | ICD-10-CM | POA: Diagnosis not present

## 2020-12-23 DIAGNOSIS — J3081 Allergic rhinitis due to animal (cat) (dog) hair and dander: Secondary | ICD-10-CM | POA: Diagnosis not present

## 2020-12-23 DIAGNOSIS — J301 Allergic rhinitis due to pollen: Secondary | ICD-10-CM | POA: Diagnosis not present

## 2021-01-06 DIAGNOSIS — M47816 Spondylosis without myelopathy or radiculopathy, lumbar region: Secondary | ICD-10-CM | POA: Diagnosis not present

## 2021-01-06 DIAGNOSIS — G894 Chronic pain syndrome: Secondary | ICD-10-CM | POA: Diagnosis not present

## 2021-01-13 ENCOUNTER — Ambulatory Visit
Admission: RE | Admit: 2021-01-13 | Discharge: 2021-01-13 | Disposition: A | Discharge status: Home / Self Care | Payer: BC Managed Care – PPO | Source: Ambulatory Visit | Attending: Family Medicine | Admitting: Family Medicine

## 2021-01-13 DIAGNOSIS — R079 Chest pain, unspecified: Secondary | ICD-10-CM

## 2021-01-13 MED ORDER — IOPAMIDOL (ISOVUE-370) INJECTION 76%
75.0000 mL | Freq: Once | INTRAVENOUS | Status: AC | PRN
  Administered 2021-01-13: 75 mL via INTRAVENOUS

## 2021-02-12 DIAGNOSIS — M47816 Spondylosis without myelopathy or radiculopathy, lumbar region: Secondary | ICD-10-CM | POA: Diagnosis not present

## 2021-03-12 DIAGNOSIS — Z780 Asymptomatic menopausal state: Secondary | ICD-10-CM | POA: Diagnosis not present

## 2021-03-12 DIAGNOSIS — M85852 Other specified disorders of bone density and structure, left thigh: Secondary | ICD-10-CM | POA: Diagnosis not present

## 2021-03-12 DIAGNOSIS — M810 Age-related osteoporosis without current pathological fracture: Secondary | ICD-10-CM | POA: Diagnosis not present

## 2021-03-12 DIAGNOSIS — Z1231 Encounter for screening mammogram for malignant neoplasm of breast: Secondary | ICD-10-CM | POA: Diagnosis not present

## 2021-03-21 ENCOUNTER — Other Ambulatory Visit (HOSPITAL_COMMUNITY)
Admission: RE | Admit: 2021-03-21 | Discharge: 2021-03-21 | Disposition: A | Discharge status: Home / Self Care | Payer: BC Managed Care – PPO | Source: Ambulatory Visit | Attending: Obstetrics and Gynecology | Admitting: Obstetrics and Gynecology

## 2021-03-21 DIAGNOSIS — Z01419 Encounter for gynecological examination (general) (routine) without abnormal findings: Secondary | ICD-10-CM | POA: Insufficient documentation

## 2021-03-24 LAB — CYTOLOGY - PAP
Comment: NEGATIVE
Diagnosis: NEGATIVE
High risk HPV: NEGATIVE

## 2021-03-26 DIAGNOSIS — M47816 Spondylosis without myelopathy or radiculopathy, lumbar region: Secondary | ICD-10-CM | POA: Diagnosis not present

## 2021-04-07 DIAGNOSIS — G894 Chronic pain syndrome: Secondary | ICD-10-CM | POA: Diagnosis not present

## 2021-04-07 DIAGNOSIS — M47816 Spondylosis without myelopathy or radiculopathy, lumbar region: Secondary | ICD-10-CM | POA: Diagnosis not present

## 2021-05-06 DIAGNOSIS — M47816 Spondylosis without myelopathy or radiculopathy, lumbar region: Secondary | ICD-10-CM | POA: Diagnosis not present

## 2021-05-19 DIAGNOSIS — M47816 Spondylosis without myelopathy or radiculopathy, lumbar region: Secondary | ICD-10-CM | POA: Diagnosis not present

## 2021-07-24 DIAGNOSIS — M7918 Myalgia, other site: Secondary | ICD-10-CM | POA: Diagnosis not present

## 2021-07-24 DIAGNOSIS — G894 Chronic pain syndrome: Secondary | ICD-10-CM | POA: Diagnosis not present

## 2021-07-24 DIAGNOSIS — M47816 Spondylosis without myelopathy or radiculopathy, lumbar region: Secondary | ICD-10-CM | POA: Diagnosis not present

## 2021-09-17 DIAGNOSIS — Z6830 Body mass index (BMI) 30.0-30.9, adult: Secondary | ICD-10-CM | POA: Diagnosis not present

## 2021-09-17 DIAGNOSIS — R051 Acute cough: Secondary | ICD-10-CM | POA: Diagnosis not present

## 2021-09-17 DIAGNOSIS — U071 COVID-19: Secondary | ICD-10-CM | POA: Diagnosis not present

## 2021-09-17 DIAGNOSIS — Z20822 Contact with and (suspected) exposure to covid-19: Secondary | ICD-10-CM | POA: Diagnosis not present

## 2021-09-29 DIAGNOSIS — H40023 Open angle with borderline findings, high risk, bilateral: Secondary | ICD-10-CM | POA: Diagnosis not present

## 2021-09-29 DIAGNOSIS — H04123 Dry eye syndrome of bilateral lacrimal glands: Secondary | ICD-10-CM | POA: Diagnosis not present

## 2021-09-29 DIAGNOSIS — H2513 Age-related nuclear cataract, bilateral: Secondary | ICD-10-CM | POA: Diagnosis not present

## 2021-11-06 DIAGNOSIS — H2513 Age-related nuclear cataract, bilateral: Secondary | ICD-10-CM | POA: Diagnosis not present

## 2021-11-06 DIAGNOSIS — H40023 Open angle with borderline findings, high risk, bilateral: Secondary | ICD-10-CM | POA: Diagnosis not present

## 2021-12-22 DIAGNOSIS — Z23 Encounter for immunization: Secondary | ICD-10-CM | POA: Diagnosis not present

## 2021-12-22 DIAGNOSIS — E785 Hyperlipidemia, unspecified: Secondary | ICD-10-CM | POA: Diagnosis not present

## 2021-12-22 DIAGNOSIS — M1612 Unilateral primary osteoarthritis, left hip: Secondary | ICD-10-CM | POA: Diagnosis not present

## 2021-12-22 DIAGNOSIS — T783XXD Angioneurotic edema, subsequent encounter: Secondary | ICD-10-CM | POA: Diagnosis not present

## 2021-12-22 DIAGNOSIS — J3081 Allergic rhinitis due to animal (cat) (dog) hair and dander: Secondary | ICD-10-CM | POA: Diagnosis not present

## 2021-12-22 DIAGNOSIS — J3089 Other allergic rhinitis: Secondary | ICD-10-CM | POA: Diagnosis not present

## 2021-12-22 DIAGNOSIS — M810 Age-related osteoporosis without current pathological fracture: Secondary | ICD-10-CM | POA: Diagnosis not present

## 2021-12-22 DIAGNOSIS — Z0000 Encounter for general adult medical examination without abnormal findings: Secondary | ICD-10-CM | POA: Diagnosis not present

## 2021-12-22 DIAGNOSIS — E559 Vitamin D deficiency, unspecified: Secondary | ICD-10-CM | POA: Diagnosis not present

## 2021-12-22 DIAGNOSIS — J301 Allergic rhinitis due to pollen: Secondary | ICD-10-CM | POA: Diagnosis not present

## 2021-12-30 DIAGNOSIS — R52 Pain, unspecified: Secondary | ICD-10-CM | POA: Diagnosis not present

## 2021-12-30 DIAGNOSIS — Z6829 Body mass index (BMI) 29.0-29.9, adult: Secondary | ICD-10-CM | POA: Diagnosis not present

## 2021-12-30 DIAGNOSIS — J029 Acute pharyngitis, unspecified: Secondary | ICD-10-CM | POA: Diagnosis not present

## 2022-01-02 DIAGNOSIS — J208 Acute bronchitis due to other specified organisms: Secondary | ICD-10-CM | POA: Diagnosis not present

## 2022-01-02 DIAGNOSIS — R509 Fever, unspecified: Secondary | ICD-10-CM | POA: Diagnosis not present

## 2022-01-02 DIAGNOSIS — J029 Acute pharyngitis, unspecified: Secondary | ICD-10-CM | POA: Diagnosis not present

## 2022-01-02 DIAGNOSIS — Z03818 Encounter for observation for suspected exposure to other biological agents ruled out: Secondary | ICD-10-CM | POA: Diagnosis not present

## 2022-01-04 DIAGNOSIS — R0981 Nasal congestion: Secondary | ICD-10-CM | POA: Diagnosis not present

## 2022-01-04 DIAGNOSIS — R051 Acute cough: Secondary | ICD-10-CM | POA: Diagnosis not present

## 2022-01-04 DIAGNOSIS — R509 Fever, unspecified: Secondary | ICD-10-CM | POA: Diagnosis not present

## 2022-02-23 DIAGNOSIS — E785 Hyperlipidemia, unspecified: Secondary | ICD-10-CM | POA: Diagnosis not present

## 2022-02-23 DIAGNOSIS — Z79899 Other long term (current) drug therapy: Secondary | ICD-10-CM | POA: Diagnosis not present

## 2022-03-18 DIAGNOSIS — Z1231 Encounter for screening mammogram for malignant neoplasm of breast: Secondary | ICD-10-CM | POA: Diagnosis not present

## 2022-03-31 DIAGNOSIS — Z01419 Encounter for gynecological examination (general) (routine) without abnormal findings: Secondary | ICD-10-CM | POA: Diagnosis not present

## 2022-05-12 DIAGNOSIS — B349 Viral infection, unspecified: Secondary | ICD-10-CM | POA: Diagnosis not present

## 2022-05-12 DIAGNOSIS — R5383 Other fatigue: Secondary | ICD-10-CM | POA: Diagnosis not present

## 2022-05-12 DIAGNOSIS — R509 Fever, unspecified: Secondary | ICD-10-CM | POA: Diagnosis not present

## 2022-05-12 DIAGNOSIS — J029 Acute pharyngitis, unspecified: Secondary | ICD-10-CM | POA: Diagnosis not present

## 2022-05-12 DIAGNOSIS — Z03818 Encounter for observation for suspected exposure to other biological agents ruled out: Secondary | ICD-10-CM | POA: Diagnosis not present

## 2022-05-12 DIAGNOSIS — R52 Pain, unspecified: Secondary | ICD-10-CM | POA: Diagnosis not present

## 2022-05-16 DIAGNOSIS — B349 Viral infection, unspecified: Secondary | ICD-10-CM | POA: Diagnosis not present

## 2022-05-16 DIAGNOSIS — R062 Wheezing: Secondary | ICD-10-CM | POA: Diagnosis not present

## 2022-05-18 DIAGNOSIS — R062 Wheezing: Secondary | ICD-10-CM | POA: Diagnosis not present

## 2022-06-18 DIAGNOSIS — H401131 Primary open-angle glaucoma, bilateral, mild stage: Secondary | ICD-10-CM | POA: Diagnosis not present

## 2022-07-22 DIAGNOSIS — H401131 Primary open-angle glaucoma, bilateral, mild stage: Secondary | ICD-10-CM | POA: Diagnosis not present

## 2022-08-14 IMAGING — CT CT BIOPSY
2 of 6 series · 12 of 32 positions shown, 18 images · non-contrast
Comparison: none

CLINICAL DATA: Left sacroiliac pain.

[Series 2: needle -guided injection · axial · 0.79mm/px · z∈[-158,-86]mm · 9 of 46 slices shown, 15 images (1 of 2)]
[im 5/46  soft-tissue]
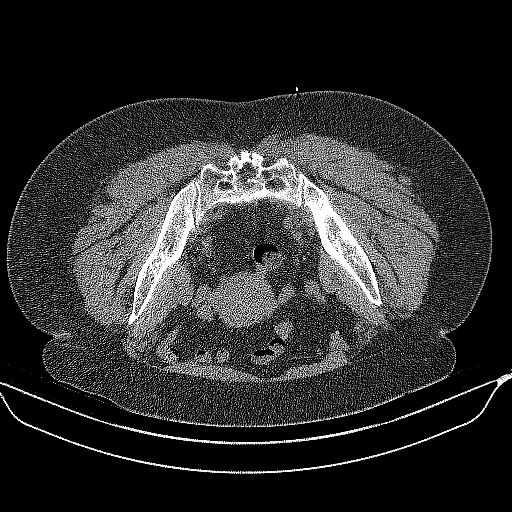
[im 5/46  bone]
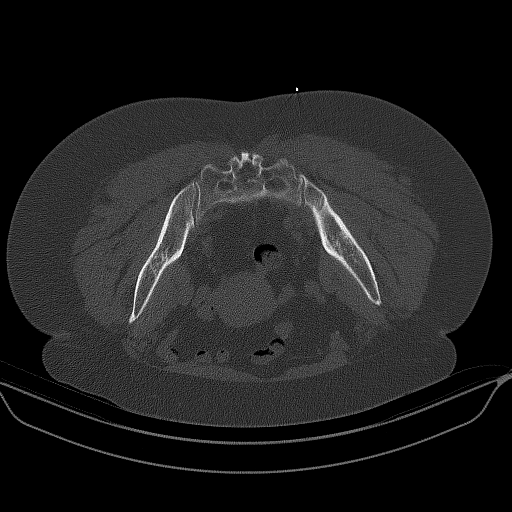
[im 10/46  soft-tissue]
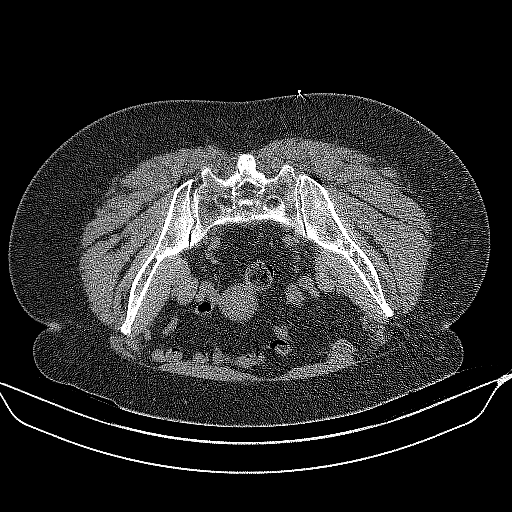
[im 14/46  soft-tissue]
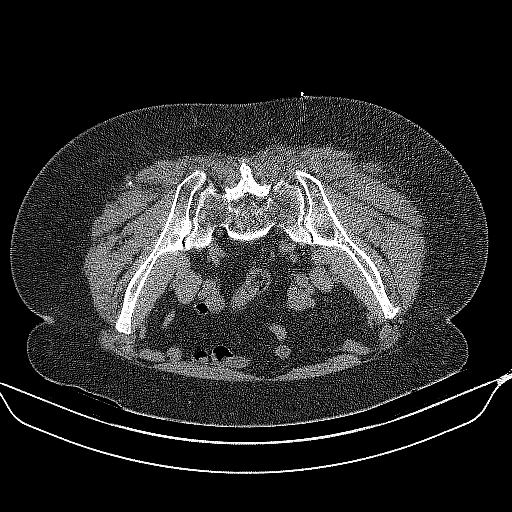
[im 19/46  soft-tissue]
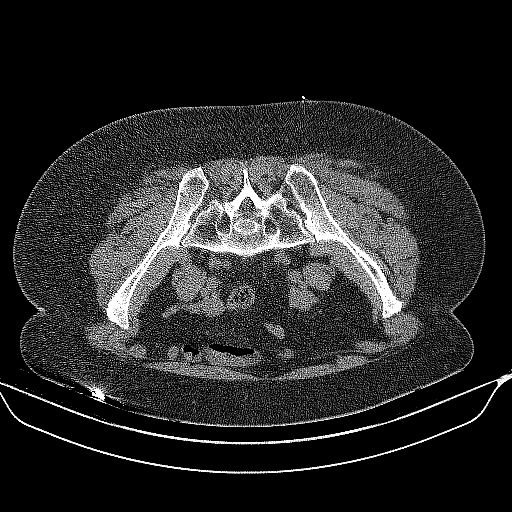
[im 23/46  soft-tissue]
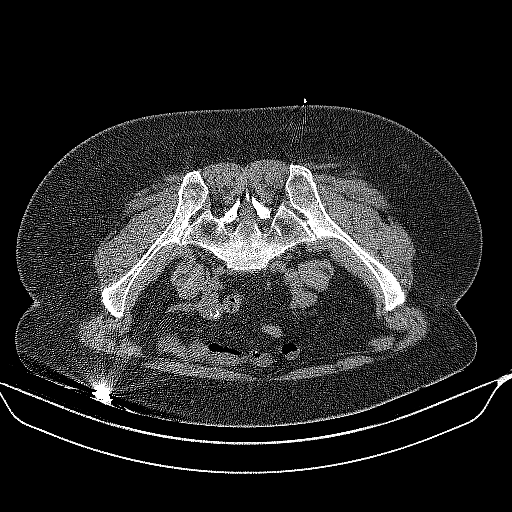
[im 28/46  soft-tissue]
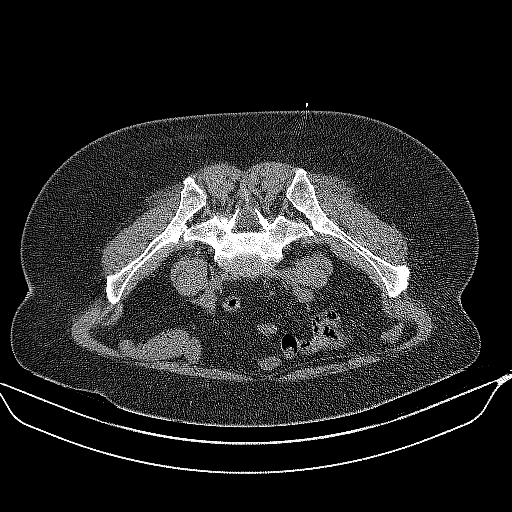
[im 28/46  lung]
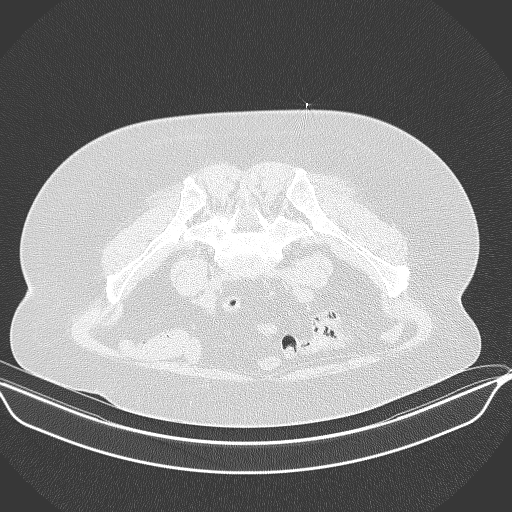
[im 32/46  soft-tissue]
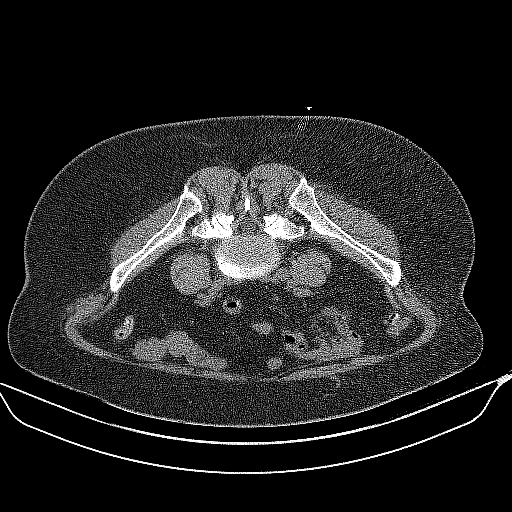
[im 32/46  lung]
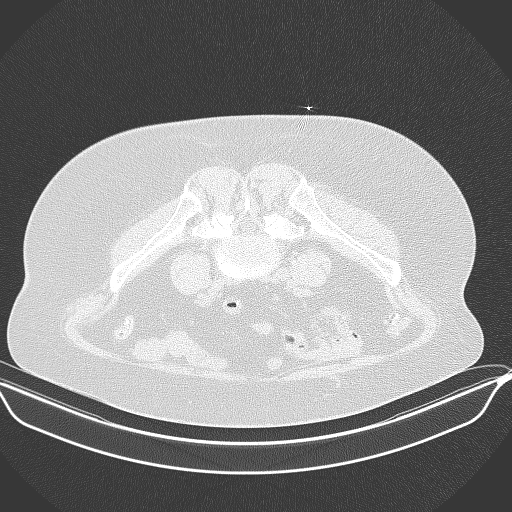
[im 37/46  soft-tissue]
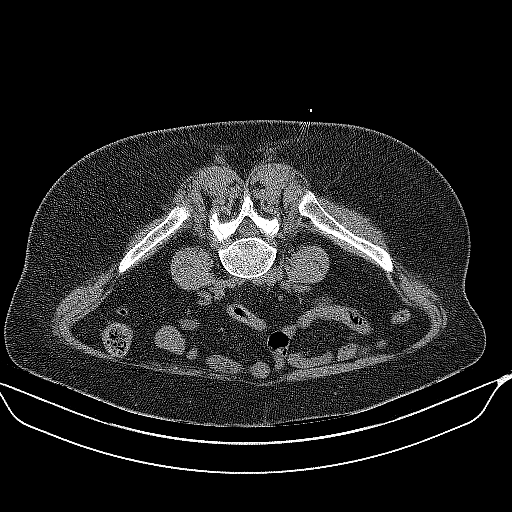
[im 37/46  lung]
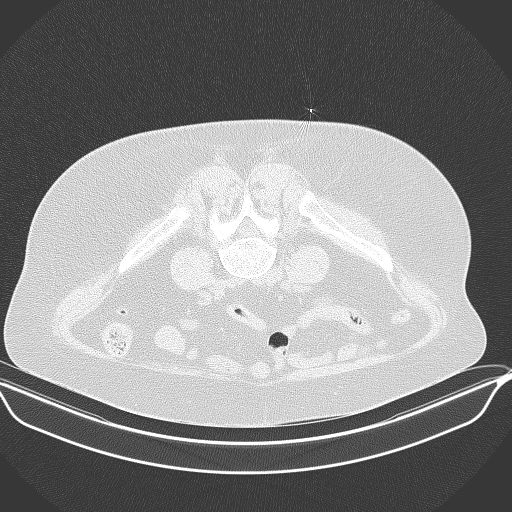
[im 41/46  soft-tissue]
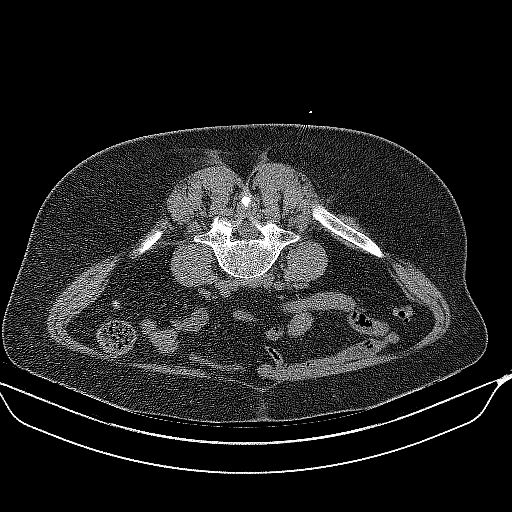
[im 41/46  lung]
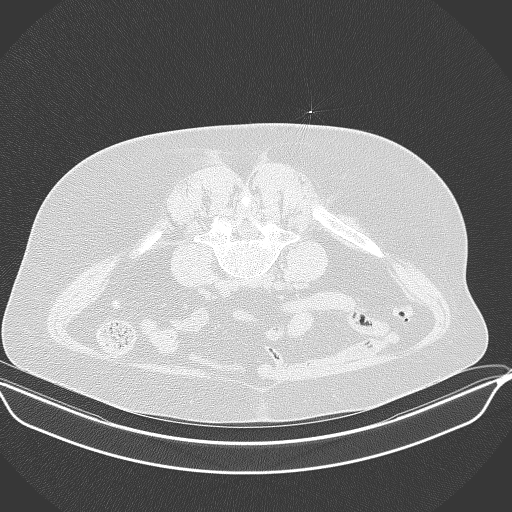
[im 41/46  bone]
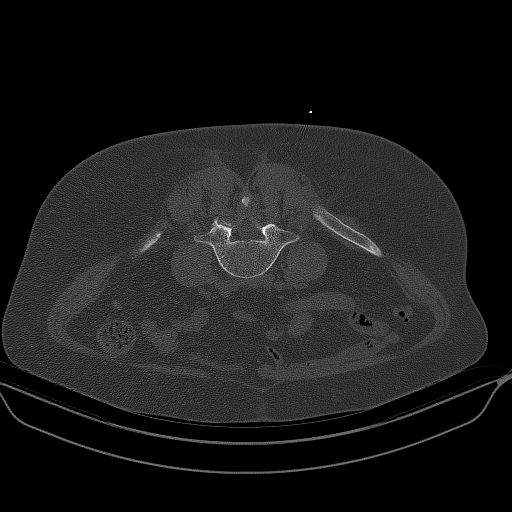

[Series 3: needle -guided injection · axial · 0.79mm/px · z∈[-157,-139]mm · 3 of 19 slices shown (2 of 2)]
[im 5/19  soft-tissue]
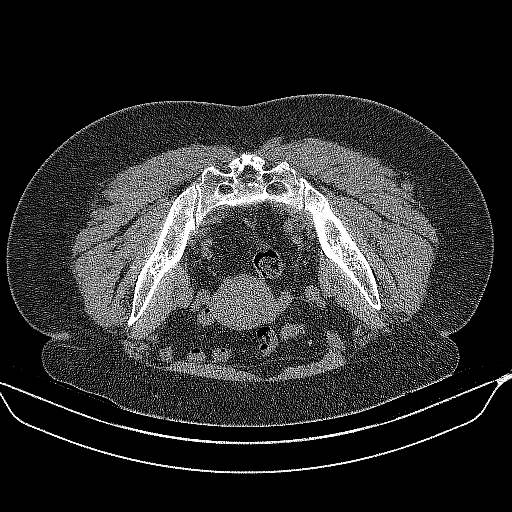
[im 10/19  soft-tissue]
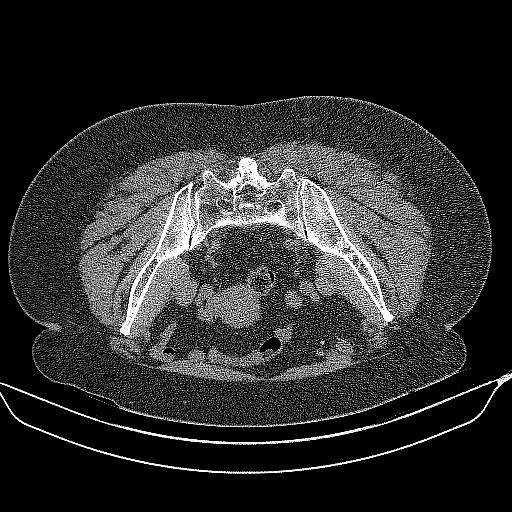
[im 14/19  soft-tissue]
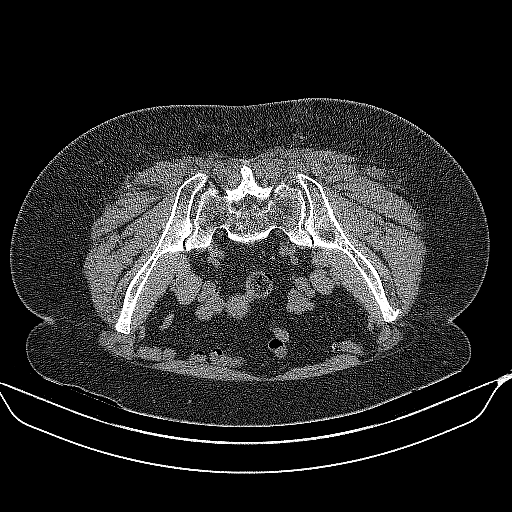

[12 of 32 positions shown; findings below may reference images not displayed]

EXAM:
CT GUIDED LEFT SI JOINT INJECTION



After local anesthesia with 1% lidocaine without epinephrine and
subsequent deep anesthesia, a 3.5 inch 22 gauge spinal needle was
advanced into the left SI joint under intermittent CT guidance.

Injection of a small amount of Isovue-M 200 demonstrated
intra-articular contrast spread. Subsequently, 2 mL of 0.5%
bupivacaine were injected into the left SI joint. The needle was
removed and a sterile dressing applied.

No complications were observed.
IMPRESSION: Successful CT-guided left SI joint injection.

## 2022-08-21 DIAGNOSIS — M62838 Other muscle spasm: Secondary | ICD-10-CM | POA: Diagnosis not present

## 2022-08-21 DIAGNOSIS — M542 Cervicalgia: Secondary | ICD-10-CM | POA: Diagnosis not present

## 2022-08-21 DIAGNOSIS — Z7184 Encounter for health counseling related to travel: Secondary | ICD-10-CM | POA: Diagnosis not present

## 2022-08-27 DIAGNOSIS — H401131 Primary open-angle glaucoma, bilateral, mild stage: Secondary | ICD-10-CM | POA: Diagnosis not present

## 2022-12-23 DIAGNOSIS — J3089 Other allergic rhinitis: Secondary | ICD-10-CM | POA: Diagnosis not present

## 2022-12-23 DIAGNOSIS — J3081 Allergic rhinitis due to animal (cat) (dog) hair and dander: Secondary | ICD-10-CM | POA: Diagnosis not present

## 2022-12-23 DIAGNOSIS — T783XXD Angioneurotic edema, subsequent encounter: Secondary | ICD-10-CM | POA: Diagnosis not present

## 2022-12-23 DIAGNOSIS — J301 Allergic rhinitis due to pollen: Secondary | ICD-10-CM | POA: Diagnosis not present

## 2023-01-12 DIAGNOSIS — M810 Age-related osteoporosis without current pathological fracture: Secondary | ICD-10-CM | POA: Diagnosis not present

## 2023-01-12 DIAGNOSIS — Z0000 Encounter for general adult medical examination without abnormal findings: Secondary | ICD-10-CM | POA: Diagnosis not present

## 2023-01-12 DIAGNOSIS — E785 Hyperlipidemia, unspecified: Secondary | ICD-10-CM | POA: Diagnosis not present

## 2023-01-12 DIAGNOSIS — Z23 Encounter for immunization: Secondary | ICD-10-CM | POA: Diagnosis not present

## 2023-01-12 DIAGNOSIS — D573 Sickle-cell trait: Secondary | ICD-10-CM | POA: Diagnosis not present

## 2023-01-12 DIAGNOSIS — E559 Vitamin D deficiency, unspecified: Secondary | ICD-10-CM | POA: Diagnosis not present

## 2023-02-24 DIAGNOSIS — R509 Fever, unspecified: Secondary | ICD-10-CM | POA: Diagnosis not present

## 2023-02-24 DIAGNOSIS — Z03818 Encounter for observation for suspected exposure to other biological agents ruled out: Secondary | ICD-10-CM | POA: Diagnosis not present

## 2023-02-24 DIAGNOSIS — J101 Influenza due to other identified influenza virus with other respiratory manifestations: Secondary | ICD-10-CM | POA: Diagnosis not present

## 2023-02-24 DIAGNOSIS — R051 Acute cough: Secondary | ICD-10-CM | POA: Diagnosis not present

## 2023-04-02 DIAGNOSIS — M8588 Other specified disorders of bone density and structure, other site: Secondary | ICD-10-CM | POA: Diagnosis not present

## 2023-04-02 DIAGNOSIS — Z1231 Encounter for screening mammogram for malignant neoplasm of breast: Secondary | ICD-10-CM | POA: Diagnosis not present

## 2023-04-05 DIAGNOSIS — Z01419 Encounter for gynecological examination (general) (routine) without abnormal findings: Secondary | ICD-10-CM | POA: Diagnosis not present

## 2023-04-05 DIAGNOSIS — N763 Subacute and chronic vulvitis: Secondary | ICD-10-CM | POA: Diagnosis not present

## 2023-06-08 DIAGNOSIS — M47814 Spondylosis without myelopathy or radiculopathy, thoracic region: Secondary | ICD-10-CM | POA: Diagnosis not present

## 2023-06-08 DIAGNOSIS — M47816 Spondylosis without myelopathy or radiculopathy, lumbar region: Secondary | ICD-10-CM | POA: Diagnosis not present

## 2023-06-29 DIAGNOSIS — L309 Dermatitis, unspecified: Secondary | ICD-10-CM | POA: Diagnosis not present

## 2023-06-30 DIAGNOSIS — H43811 Vitreous degeneration, right eye: Secondary | ICD-10-CM | POA: Diagnosis not present

## 2023-06-30 DIAGNOSIS — H401131 Primary open-angle glaucoma, bilateral, mild stage: Secondary | ICD-10-CM | POA: Diagnosis not present

## 2023-06-30 DIAGNOSIS — H538 Other visual disturbances: Secondary | ICD-10-CM | POA: Diagnosis not present

## 2023-06-30 DIAGNOSIS — H1045 Other chronic allergic conjunctivitis: Secondary | ICD-10-CM | POA: Diagnosis not present

## 2023-07-07 DIAGNOSIS — H8112 Benign paroxysmal vertigo, left ear: Secondary | ICD-10-CM | POA: Diagnosis not present

## 2023-07-19 DIAGNOSIS — M546 Pain in thoracic spine: Secondary | ICD-10-CM | POA: Diagnosis not present

## 2023-07-19 DIAGNOSIS — M47814 Spondylosis without myelopathy or radiculopathy, thoracic region: Secondary | ICD-10-CM | POA: Diagnosis not present

## 2023-07-19 DIAGNOSIS — G8929 Other chronic pain: Secondary | ICD-10-CM | POA: Diagnosis not present

## 2023-07-20 ENCOUNTER — Ambulatory Visit: Attending: Family Medicine

## 2023-07-20 DIAGNOSIS — H8112 Benign paroxysmal vertigo, left ear: Secondary | ICD-10-CM | POA: Diagnosis not present

## 2023-07-20 DIAGNOSIS — R42 Dizziness and giddiness: Secondary | ICD-10-CM | POA: Insufficient documentation

## 2023-07-20 NOTE — Therapy (Signed)
 OUTPATIENT PHYSICAL THERAPY VESTIBULAR EVALUATION     Patient Name: Leah Harvey MRN: [id number] DOB:10-22-1960, 63 y.o., female Today's Date: 07/21/2023  END OF SESSION:  PT End of Session - 07/20/23 1621     Visit Number 1    Number of Visits 8    Date for PT Re-Evaluation 08/18/23    Authorization Type BC[employer]    PT Start Time 1620    PT Stop Time 1700    PT Time Calculation (min) 40 min          Past Medical History:  Diagnosis Date   Hypercholesteremia    Past Surgical History:  Procedure Laterality Date   No prior surgery     Patient Active Problem List   Diagnosis Date Noted   Chest pain, rule out acute myocardial infarction 11/20/2016   Hyperlipidemia 11/20/2016   Elevated fasting glucose 11/20/2016   Hypertension 11/20/2016    PCP: Victorio Grave, MD REFERRING PROVIDER: Victorio Grave, MD  REFERRING DIAG:  [phone] (ICD-10-CM) - Benign paroxysmal vertigo, left ear    THERAPY DIAG:  Dizziness and giddiness  BPPV (benign paroxysmal positional vertigo), left  ONSET DATE: several months  Rationale for Evaluation and Treatment: Rehabilitation  SUBJECTIVE:   SUBJECTIVE STATEMENT: Dizziness with looking up and change of position and notes more frequent episodes and intensity when laying on left side. Pt reports remote hx of vertigo that resolved on its own. This episode has been going on for several months and notes it has a tendency to come and go. Reports the last severe dizziness episode was late April-May.  Pt accompanied by: self  PERTINENT HISTORY:   PAIN:  Are you having pain? No  PRECAUTIONS: None  RED FLAGS: None   WEIGHT BEARING RESTRICTIONS: No  FALLS: Has patient fallen in last 6 months? No   PLOF: Independent  PATIENT GOALS: eliminate symptoms  OBJECTIVE:  Note: Objective measures were completed at Evaluation unless otherwise noted.  DIAGNOSTIC FINDINGS:     POSTURE:  No Significant postural limitations  Cervical  ROM:    Active A/PROM (deg) eval  Flexion WNL  Extension WNL  Right lateral flexion WNL  Left lateral flexion WNL  Right rotation WNL  Left rotation WNL  (Blank rows = not tested)  STRENGTH:   LOWER EXTREMITY MMT:   MMT Right eval Left eval  Hip flexion    Hip abduction    Hip adduction    Hip internal rotation    Hip external rotation    Knee flexion    Knee extension    Ankle dorsiflexion    Ankle plantarflexion    Ankle inversion    Ankle eversion    (Blank rows = not tested)    GAIT: Gait pattern: WFL Distance walked:  Assistive device utilized: None Level of assistance: Complete Independence Comments:    PATIENT SURVEYS:  DHI 40%  VESTIBULAR ASSESSMENT:  GENERAL OBSERVATION:    SYMPTOM BEHAVIOR:wears bifocals  Subjective history: on/off hx of positional dizziness, reports last significant episode was Apri-May  Non-Vestibular symptoms: none reported  Type of dizziness: Spinning/Vertigo  Frequency: comes and goes  Duration: seconds-minutes  Aggravating factors: Induced by position change: lying supine, rolling to the right, rolling to the left, and supine to sit and Induced by motion: looking up at the ceiling, bending down to the ground, turning body quickly, and turning head quickly  Relieving factors: slow movements  Progression of symptoms: better  OCULOMOTOR EXAM:  Ocular Alignment: normal  Ocular ROM: No  Limitations  Spontaneous Nystagmus: absent  Gaze-Induced Nystagmus: absent  Smooth Pursuits: intact  Saccades: intact  Convergence/Divergence:   cm   VESTIBULAR - OCULAR REFLEX:   Slow VOR: Normal  VOR Cancellation: Comment: normal w/ some dizziness experienced  Head-Impulse Test: HIT Right: positive HIT Left: negative  Dynamic Visual Acuity: Not able to be assessed   POSITIONAL TESTING: Right Dix-Hallpike: no nystagmus Left Dix-Hallpike: upbeating, left nystagmus Right Roll Test: no nystagmus Left Roll Test: no  nystagmus  MOTION SENSITIVITY:  Motion Sensitivity Quotient Intensity: 0 = none, 1 = Lightheaded, 2 = Mild, 3 = Moderate, 4 = Severe, 5 = Vomiting  Intensity  1. Sitting to supine   2. Supine to L side   3. Supine to R side   4. Supine to sitting   5. L Hallpike-Dix   6. Up from L    7. R Hallpike-Dix   8. Up from R    9. Sitting, head tipped to L knee   10. Head up from L knee   11. Sitting, head tipped to R knee   12. Head up from R knee   13. Sitting head turns x5   14.Sitting head nods x5   15. In stance, 180 turn to L    16. In stance, 180 turn to R     OTHOSTATICS: not done  FUNCTIONAL GAIT: Functional gait assessment: TBD  M-CTSIB: TBD                                                                                                                            TREATMENT DATE: 07/20/23   Canalith Repositioning:  Epley Left: Comment: 1 repetition   PATIENT EDUCATION: Education details: assessment details, intervention rationale Person educated: Patient Education method: Explanation Education comprehension: verbalized understanding  HOME EXERCISE PROGRAM:  GOALS: Goals reviewed with patient? Yes  SHORT TERM GOALS: Target date: same as LTG   LONG TERM GOALS: Target date: 08/18/2023    Patient will be independent in HEP to improve functional outcomes Baseline:  Goal status: INITIAL  2.  Pt to report improved symptoms per score 22% DHI Baseline: 40% Goal status: INITIAL  3.  Pt to be free of positional dizziness to reduce fall risk/balance impairments Baseline: +left Dix-Hallpike Goal status: INITIAL   ASSESSMENT:  CLINICAL IMPRESSION: Patient is a 63 y.o. lady who was seen today for physical therapy evaluation and treatment for positional dizziness.  Exam reveals some motion sensitivity to head/body movements and positional dizziness provoked by left Dix-Hallpike position with left upbeating nystagmus.  Pt reports minimal issues affecting her  ambulation and balance and only notes some dysfunction to ADL/mobility with certain movements such as bending forward or tipping head back to look up. PT services would be beneficial to address symptoms    OBJECTIVE IMPAIRMENTS: decreased balance and dizziness.   ACTIVITY LIMITATIONS: bending and reach over head  PARTICIPATION LIMITATIONS: community activity and yard work  PERSONAL FACTORS: Age and  Time since onset of injury/illness/exacerbation are also affecting patient's functional outcome.   REHAB POTENTIAL: Excellent  CLINICAL DECISION MAKING: Stable/uncomplicated  EVALUATION COMPLEXITY: Low   PLAN:  PT FREQUENCY: 1-2x/week  PT DURATION: 4 weeks  PLANNED INTERVENTIONS: 97750- Physical Performance Testing, 97110-Therapeutic exercises, 97530- Therapeutic activity, [id number]- Neuromuscular re-education, 97535- Self Care, [address]- Manual therapy, [id number]- Gait training, and [phone]- Canalith repositioning  PLAN FOR NEXT SESSION: re-test left dix-hallpike   12:44 PM, 07/21/23 M. Kelly Amy Belloso, PT, DPT Physical Therapist- [place] Office Number: [phone]

## 2023-07-21 ENCOUNTER — Other Ambulatory Visit: Payer: Self-pay

## 2023-07-21 NOTE — Therapy (Signed)
 OUTPATIENT PHYSICAL THERAPY VESTIBULAR TREATMENT     Patient Name: Leah Harvey MRN: [id number] DOB:04/02/1960, 63 y.o., female Today's Date: 07/22/2023  END OF SESSION:  PT End of Session - 07/22/23 1444     Visit Number 2    Number of Visits 8    Date for PT Re-Evaluation 08/18/23    Authorization Type [employer]BS    PT Start Time 1404    PT Stop Time 1444    PT Time Calculation (min) 40 min    Equipment Utilized During Treatment Gait belt    Activity Tolerance Patient tolerated treatment well    Behavior During Therapy WFL for tasks assessed/performed           Past Medical History:  Diagnosis Date   Hypercholesteremia    Past Surgical History:  Procedure Laterality Date   No prior surgery     Patient Active Problem List   Diagnosis Date Noted   Chest pain, rule out acute myocardial infarction 11/20/2016   Hyperlipidemia 11/20/2016   Elevated fasting glucose 11/20/2016   Hypertension 11/20/2016    PCP: Victorio Grave, MD REFERRING PROVIDER: Victorio Grave, MD  REFERRING DIAG:  [phone] (ICD-10-CM) - Benign paroxysmal vertigo, left ear    THERAPY DIAG:  Dizziness and giddiness  BPPV (benign paroxysmal positional vertigo), left  ONSET DATE: several months  Rationale for Evaluation and Treatment: Rehabilitation  SUBJECTIVE:   SUBJECTIVE STATEMENT: Reports that since yesterday to mid-morning I was dizzy.    Pt accompanied by: self  PERTINENT HISTORY:   PAIN:  Are you having pain? No  PRECAUTIONS: None  RED FLAGS: None   WEIGHT BEARING RESTRICTIONS: No  FALLS: Has patient fallen in last 6 months? No   PLOF: Independent  PATIENT GOALS: eliminate symptoms  OBJECTIVE:       TODAY'S TREATMENT: 07/22/23    M-CTSIB  Condition 1: Firm Surface, EO 30 Sec, Mild Sway  Condition 2: Firm Surface, EC 30 Sec, Mild and Moderate Sway  Condition 3: Foam Surface, EO 30 Sec, Moderate Sway  Condition 4: Foam Surface, EC 30 Sec, Moderate and Severe  Sway     Activity Comments  FGA 27/30  L loaded DH L upbeating torsional nystagmus persisting however amplitude did decrease after ~40sec  L epley  Tolerated well; c/o some dizziness in position 3 but no nystagmus observed   L DH Latent persistent small amplitude L upbeating torsional nystagmus. C/o dizziness upon sitting up   L Brandt daroff x2 Tolerated well; mild dizziness upon sitting up         [facility] PT Assessment - 07/22/23 0001       Functional Gait  Assessment   Gait assessed  Yes    Gait Level Surface Walks 20 ft in less than 7 sec but greater than 5.5 sec, uses assistive device, slower speed, mild gait deviations, or deviates 6-10 in outside of the 12 in walkway width.    Change in Gait Speed Able to smoothly change walking speed without loss of balance or gait deviation. Deviate no more than 6 in outside of the 12 in walkway width.    Gait with Horizontal Head Turns Performs head turns smoothly with no change in gait. Deviates no more than 6 in outside 12 in walkway width    Gait with Vertical Head Turns Performs head turns with no change in gait. Deviates no more than 6 in outside 12 in walkway width.    Gait and Pivot Turn Pivot turns safely in greater  than 3 sec and stops with no loss of balance, or pivot turns safely within 3 sec and stops with mild imbalance, requires small steps to catch balance.   dizzy   Step Over Obstacle Is able to step over 2 stacked shoe boxes taped together (9 in total height) without changing gait speed. No evidence of imbalance.    Gait with Narrow Base of Support Is able to ambulate for 10 steps heel to toe with no staggering.    Gait with Eyes Closed Walks 20 ft, no assistive devices, good speed, no evidence of imbalance, normal gait pattern, deviates no more than 6 in outside 12 in walkway width. Ambulates 20 ft in less than 7 sec.    Ambulating Backwards Walks 20 ft, uses assistive device, slower speed, mild gait deviations, deviates 6-10 in  outside 12 in walkway width.    Steps Alternating feet, no rail.    Total Score 27          HOME EXERCISE PROGRAM Last updated: 07/22/23 Access Code: [email or web address] URL: [email or web address] Date: 07/22/2023 Prepared by: [facility] - Outpatient  Rehab - [facility]  Exercises - Brandt-Daroff Vestibular Exercise  - 1 x daily - 5 x weekly - 2 sets - 3-5 reps   PATIENT EDUCATION: Education details: edu on benefits of habituation; HEP with edu for safety and to perform with family assist Person educated: Patient Education method: Explanation, Demonstration, Tactile cues, Verbal cues, and Handouts Education comprehension: verbalized understanding and returned demonstration    Note: Objective measures were completed at Evaluation unless otherwise noted.  DIAGNOSTIC FINDINGS:     POSTURE:  No Significant postural limitations  Cervical ROM:    Active A/PROM (deg) eval  Flexion WNL  Extension WNL  Right lateral flexion WNL  Left lateral flexion WNL  Right rotation WNL  Left rotation WNL  (Blank rows = not tested)  STRENGTH:   LOWER EXTREMITY MMT:   MMT Right eval Left eval  Hip flexion    Hip abduction    Hip adduction    Hip internal rotation    Hip external rotation    Knee flexion    Knee extension    Ankle dorsiflexion    Ankle plantarflexion    Ankle inversion    Ankle eversion    (Blank rows = not tested)    GAIT: Gait pattern: WFL Distance walked:  Assistive device utilized: None Level of assistance: Complete Independence Comments:    PATIENT SURVEYS:  DHI 40%  VESTIBULAR ASSESSMENT:  GENERAL OBSERVATION:    SYMPTOM BEHAVIOR:wears bifocals  Subjective history: on/off hx of positional dizziness, reports last significant episode was Apri-May  Non-Vestibular symptoms: none reported  Type of dizziness: Spinning/Vertigo  Frequency: comes and goes  Duration: seconds-minutes  Aggravating factors: Induced by position  change: lying supine, rolling to the right, rolling to the left, and supine to sit and Induced by motion: looking up at the ceiling, bending down to the ground, turning body quickly, and turning head quickly  Relieving factors: slow movements  Progression of symptoms: better  OCULOMOTOR EXAM:  Ocular Alignment: normal  Ocular ROM: No Limitations  Spontaneous Nystagmus: absent  Gaze-Induced Nystagmus: absent  Smooth Pursuits: intact  Saccades: intact  Convergence/Divergence:   cm   VESTIBULAR - OCULAR REFLEX:   Slow VOR: Normal  VOR Cancellation: Comment: normal w/ some dizziness experienced  Head-Impulse Test: HIT Right: positive HIT Left: negative  Dynamic Visual Acuity: Not able to be assessed   POSITIONAL TESTING: Right  Dix-Hallpike: no nystagmus Left Dix-Hallpike: upbeating, left nystagmus Right Roll Test: no nystagmus Left Roll Test: no nystagmus  MOTION SENSITIVITY:  Motion Sensitivity Quotient Intensity: 0 = none, 1 = Lightheaded, 2 = Mild, 3 = Moderate, 4 = Severe, 5 = Vomiting  Intensity  1. Sitting to supine   2. Supine to L side   3. Supine to R side   4. Supine to sitting   5. L Hallpike-Dix   6. Up from L    7. R Hallpike-Dix   8. Up from R    9. Sitting, head tipped to L knee   10. Head up from L knee   11. Sitting, head tipped to R knee   12. Head up from R knee   13. Sitting head turns x5   14.Sitting head nods x5   15. In stance, 180 turn to L    16. In stance, 180 turn to R     OTHOSTATICS: not done  FUNCTIONAL GAIT: Functional gait assessment: TBD  M-CTSIB: TBD                                                                                                                            TREATMENT DATE: 07/20/23   Canalith Repositioning:  Epley Left: Comment: 1 repetition   PATIENT EDUCATION: Education details: assessment details, intervention rationale Person educated: Patient Education method: Explanation Education comprehension:  verbalized understanding  HOME EXERCISE PROGRAM:  GOALS: Goals reviewed with patient? Yes  SHORT TERM GOALS: Target date: same as LTG   LONG TERM GOALS: Target date: 08/18/2023    Patient will be independent in HEP to improve functional outcomes Baseline:  Goal status: IN PROGRESS  2.  Pt to report improved symptoms per score 22% DHI Baseline: 40% Goal status: IN PROGRESS  3.  Pt to be free of positional dizziness to reduce fall risk/balance impairments Baseline: +left Dix-Hallpike Goal status: IN PROGRESS   ASSESSMENT:  CLINICAL IMPRESSION: Patient arrived to session with report of some dizziness yesterday and into this AM. Patient scored 27/30 on FGA indicating a decreased risk of falls. However, increased sway was evident with multisensory balance testing. Positional testing revealed remaining L posterior canalithiasis vs. Cupulolithiasis, treated with L Epley without resolution of symptoms. Trialed habituation which patient tolerated well today and added into HEP. Patient stated that she felt safe to drive upon leaving.   OBJECTIVE IMPAIRMENTS: decreased balance and dizziness.   ACTIVITY LIMITATIONS: bending and reach over head  PARTICIPATION LIMITATIONS: community activity and yard work  PERSONAL FACTORS: Age and Time since onset of injury/illness/exacerbation are also affecting patient's functional outcome.   REHAB POTENTIAL: Excellent  CLINICAL DECISION MAKING: Stable/uncomplicated  EVALUATION COMPLEXITY: Low   PLAN:  PT FREQUENCY: 1-2x/week  PT DURATION: 4 weeks  PLANNED INTERVENTIONS: 97750- Physical Performance Testing, 97110-Therapeutic exercises, 97530- Therapeutic activity, [id number]- Neuromuscular re-education, 97535- Self Care, 69629- Manual therapy, [id number]- Gait training, and [phone]- Canalith repositioning  PLAN FOR NEXT SESSION:  re-test left dix-hallpike- may try vibration, semont, or bow and yaw if this does not resolve    Thaddeus Filippo, PT, DPT 07/22/23 2:46 PM  [facility] at [facility] [address] [place], [place] [address] Phone # [phone] Fax # [phone]

## 2023-07-22 ENCOUNTER — Encounter: Payer: Self-pay | Admitting: Physical Therapy

## 2023-07-22 ENCOUNTER — Ambulatory Visit: Admitting: Physical Therapy

## 2023-07-22 DIAGNOSIS — H8112 Benign paroxysmal vertigo, left ear: Secondary | ICD-10-CM | POA: Diagnosis not present

## 2023-07-22 DIAGNOSIS — R42 Dizziness and giddiness: Secondary | ICD-10-CM | POA: Diagnosis not present

## 2023-07-26 DIAGNOSIS — M546 Pain in thoracic spine: Secondary | ICD-10-CM | POA: Diagnosis not present

## 2023-07-26 DIAGNOSIS — M47814 Spondylosis without myelopathy or radiculopathy, thoracic region: Secondary | ICD-10-CM | POA: Diagnosis not present

## 2023-07-26 DIAGNOSIS — G8929 Other chronic pain: Secondary | ICD-10-CM | POA: Diagnosis not present

## 2023-07-28 ENCOUNTER — Ambulatory Visit

## 2023-07-28 DIAGNOSIS — R42 Dizziness and giddiness: Secondary | ICD-10-CM | POA: Diagnosis not present

## 2023-07-28 DIAGNOSIS — H8112 Benign paroxysmal vertigo, left ear: Secondary | ICD-10-CM

## 2023-07-28 NOTE — Therapy (Signed)
 OUTPATIENT PHYSICAL THERAPY VESTIBULAR TREATMENT     Patient Name: Leah Harvey MRN: [id number] DOB:April 18, 1960, 63 y.o., female Today's Date: 07/28/2023  END OF SESSION:  PT End of Session - 07/28/23 1537     Visit Number 3    Number of Visits 8    Date for PT Re-Evaluation 08/18/23    Authorization Type BCBS    PT Start Time 1535    PT Stop Time 1615    PT Time Calculation (min) 40 min    Equipment Utilized During Treatment Gait belt    Activity Tolerance Patient tolerated treatment well    Behavior During Therapy WFL for tasks assessed/performed           Past Medical History:  Diagnosis Date   Hypercholesteremia    Past Surgical History:  Procedure Laterality Date   No prior surgery     Patient Active Problem List   Diagnosis Date Noted   Chest pain, rule out acute myocardial infarction 11/20/2016   Hyperlipidemia 11/20/2016   Elevated fasting glucose 11/20/2016   Hypertension 11/20/2016    PCP: Teresa Channel, MD REFERRING PROVIDER: Teresa Channel, MD  REFERRING DIAG:  [phone] (ICD-10-CM) - Benign paroxysmal vertigo, left ear    THERAPY DIAG:  Dizziness and giddiness  BPPV (benign paroxysmal positional vertigo), left  ONSET DATE: several months  Rationale for Evaluation and Treatment: Rehabilitation  SUBJECTIVE:   SUBJECTIVE STATEMENT: Positional dizziness most obvious with lying on left side and rolling in bed.   Pt accompanied by: self  PERTINENT HISTORY:   PAIN:  Are you having pain? No  PRECAUTIONS: None  RED FLAGS: None   WEIGHT BEARING RESTRICTIONS: No  FALLS: Has patient fallen in last 6 months? No   PLOF: Independent  PATIENT GOALS: eliminate symptoms  OBJECTIVE:   TODAY'S TREATMENT: 07/28/23 Activity Comments  Loaded Left Dix-Hallpike Over pillow. Persistent left upbeating x 40 sec  Left Epley w/ vibration   Loaded Left Dix-Hallpike 8 sec latency, 20 sec small amplitude left upbeat  Left Epley w/ vibration    Loaded Left Dix-Hallpike Possibly a few small beats?  Left Semont Maneuver   Education for self-Dix-Hallpike and Brandt-Daroff for repositioning/habituation         TODAY'S TREATMENT: 07/22/23    M-CTSIB  Condition 1: Firm Surface, EO 30 Sec, Mild Sway  Condition 2: Firm Surface, EC 30 Sec, Mild and Moderate Sway  Condition 3: Foam Surface, EO 30 Sec, Moderate Sway  Condition 4: Foam Surface, EC 30 Sec, Moderate and Severe Sway     Activity Comments  FGA 27/30  L loaded DH L upbeating torsional nystagmus persisting however amplitude did decrease after ~40sec  L epley  Tolerated well; c/o some dizziness in position 3 but no nystagmus observed   L DH Latent persistent small amplitude L upbeating torsional nystagmus. C/o dizziness upon sitting up   L Brandt daroff x2 Tolerated well; mild dizziness upon sitting up           HOME EXERCISE PROGRAM Last updated: 07/22/23 Access Code: [email or web address] URL: [email or web address] Date: 07/22/2023 Prepared by: [facility] - Outpatient  Rehab - [facility]  Exercises - Brandt-Daroff Vestibular Exercise  - 1 x daily - 5 x weekly - 2 sets - 3-5 reps   PATIENT EDUCATION: Education details: edu on benefits of habituation; HEP with edu for safety and to perform with family assist Person educated: Patient Education method: Explanation, Demonstration, Tactile cues, Verbal cues, and Handouts Education comprehension: verbalized understanding and returned demonstration  Note: Objective measures were completed at Evaluation unless otherwise noted.  DIAGNOSTIC FINDINGS:     POSTURE:  No Significant postural limitations  Cervical ROM:    Active A/PROM (deg) eval  Flexion WNL  Extension WNL  Right lateral flexion WNL  Left lateral flexion WNL  Right rotation WNL  Left rotation WNL  (Blank rows = not tested)  STRENGTH:   LOWER EXTREMITY MMT:   MMT Right eval Left eval  Hip flexion    Hip abduction     Hip adduction    Hip internal rotation    Hip external rotation    Knee flexion    Knee extension    Ankle dorsiflexion    Ankle plantarflexion    Ankle inversion    Ankle eversion    (Blank rows = not tested)    GAIT: Gait pattern: WFL Distance walked:  Assistive device utilized: None Level of assistance: Complete Independence Comments:    PATIENT SURVEYS:  DHI 40%  VESTIBULAR ASSESSMENT:  GENERAL OBSERVATION:    SYMPTOM BEHAVIOR:wears bifocals  Subjective history: on/off hx of positional dizziness, reports last significant episode was Apri-May  Non-Vestibular symptoms: none reported  Type of dizziness: Spinning/Vertigo  Frequency: comes and goes  Duration: seconds-minutes  Aggravating factors: Induced by position change: lying supine, rolling to the right, rolling to the left, and supine to sit and Induced by motion: looking up at the ceiling, bending down to the ground, turning body quickly, and turning head quickly  Relieving factors: slow movements  Progression of symptoms: better  OCULOMOTOR EXAM:  Ocular Alignment: normal  Ocular ROM: No Limitations  Spontaneous Nystagmus: absent  Gaze-Induced Nystagmus: absent  Smooth Pursuits: intact  Saccades: intact  Convergence/Divergence:   cm   VESTIBULAR - OCULAR REFLEX:   Slow VOR: Normal  VOR Cancellation: Comment: normal w/ some dizziness experienced  Head-Impulse Test: HIT Right: positive HIT Left: negative  Dynamic Visual Acuity: Not able to be assessed   POSITIONAL TESTING: Right Dix-Hallpike: no nystagmus Left Dix-Hallpike: upbeating, left nystagmus Right Roll Test: no nystagmus Left Roll Test: no nystagmus  MOTION SENSITIVITY:  Motion Sensitivity Quotient Intensity: 0 = none, 1 = Lightheaded, 2 = Mild, 3 = Moderate, 4 = Severe, 5 = Vomiting  Intensity  1. Sitting to supine   2. Supine to L side   3. Supine to R side   4. Supine to sitting   5. L Hallpike-Dix   6. Up from L    7. R  Hallpike-Dix   8. Up from R    9. Sitting, head tipped to L knee   10. Head up from L knee   11. Sitting, head tipped to R knee   12. Head up from R knee   13. Sitting head turns x5   14.Sitting head nods x5   15. In stance, 180 turn to L    16. In stance, 180 turn to R     OTHOSTATICS: not done  FUNCTIONAL GAIT: Functional gait assessment: TBD  M-CTSIB: TBD  TREATMENT DATE: 07/20/23   Canalith Repositioning:  Epley Left: Comment: 1 repetition   PATIENT EDUCATION: Education details: assessment details, intervention rationale Person educated: Patient Education method: Explanation Education comprehension: verbalized understanding  HOME EXERCISE PROGRAM:  GOALS: Goals reviewed with patient? Yes  SHORT TERM GOALS: Target date: same as LTG   LONG TERM GOALS: Target date: 08/18/2023    Patient will be independent in HEP to improve functional outcomes Baseline:  Goal status: IN PROGRESS  2.  Pt to report improved symptoms per score 22% DHI Baseline: 40% Goal status: IN PROGRESS  3.  Pt to be free of positional dizziness to reduce fall risk/balance impairments Baseline: +left Dix-Hallpike Goal status: IN PROGRESS   ASSESSMENT:  CLINICAL IMPRESSION: Notes continued positional vertigo w/ lying on left side or rolling to left. Left Dix-Hallpike reveals left upbeating nystagmus w/ decreased time on 2nd repetition following left Epley with vibration.  Semont maneuver for liberatory maneuver and educated on self-performance of Dix-Hallpike and Brandt-Daroff for home performance  OBJECTIVE IMPAIRMENTS: decreased balance and dizziness.   ACTIVITY LIMITATIONS: bending and reach over head  PARTICIPATION LIMITATIONS: community activity and yard work  PERSONAL FACTORS: Age and Time since onset of injury/illness/exacerbation are also affecting patient's  functional outcome.   REHAB POTENTIAL: Excellent  CLINICAL DECISION MAKING: Stable/uncomplicated  EVALUATION COMPLEXITY: Low   PLAN:  PT FREQUENCY: 1-2x/week  PT DURATION: 4 weeks  PLANNED INTERVENTIONS: 97750- Physical Performance Testing, 97110-Therapeutic exercises, 97530- Therapeutic activity, [id number]- Neuromuscular re-education, 97535- Self Care, [id number]- Manual therapy, [id number]- Gait training, and [phone]- Canalith repositioning  PLAN FOR NEXT SESSION: re-test left dix-hallpike- may try vibration, semont, or bow and yaw if this does not resolve    4:18 PM, 07/28/23 M. Kelly Guled Gahan, PT, DPT Physical Therapist- [place] Office Number: [phone]

## 2023-07-30 ENCOUNTER — Ambulatory Visit

## 2023-07-30 DIAGNOSIS — R42 Dizziness and giddiness: Secondary | ICD-10-CM | POA: Diagnosis not present

## 2023-07-30 DIAGNOSIS — H8112 Benign paroxysmal vertigo, left ear: Secondary | ICD-10-CM | POA: Diagnosis not present

## 2023-07-30 NOTE — Therapy (Signed)
 OUTPATIENT PHYSICAL THERAPY VESTIBULAR TREATMENT     Patient Name: Leah Harvey MRN: [id number] DOB:Jul 13, 1960, 63 y.o., female Today's Date: 07/30/2023  END OF SESSION:  PT End of Session - 07/30/23 0851     Visit Number 4    Number of Visits 8    Date for PT Re-Evaluation 08/18/23    Authorization Type BCBS    PT Start Time [id number]    PT Stop Time 0930    PT Time Calculation (min) 40 min    Equipment Utilized During Treatment Gait belt    Activity Tolerance Patient tolerated treatment well    Behavior During Therapy WFL for tasks assessed/performed           Past Medical History:  Diagnosis Date   Hypercholesteremia    Past Surgical History:  Procedure Laterality Date   No prior surgery     Patient Active Problem List   Diagnosis Date Noted   Chest pain, rule out acute myocardial infarction 11/20/2016   Hyperlipidemia 11/20/2016   Elevated fasting glucose 11/20/2016   Hypertension 11/20/2016    PCP: Teresa Channel, MD REFERRING PROVIDER: Teresa Channel, MD  REFERRING DIAG:  [phone] (ICD-10-CM) - Benign paroxysmal vertigo, left ear    THERAPY DIAG:  Dizziness and giddiness  BPPV (benign paroxysmal positional vertigo), left  ONSET DATE: several months  Rationale for Evaluation and Treatment: Rehabilitation  SUBJECTIVE:   SUBJECTIVE STATEMENT: Did not notice any symptoms with getting in/out of bed yesterday and tried the Dix-Hallpike and did not feel anything   Pt accompanied by: self  PERTINENT HISTORY:   PAIN:  Are you having pain? No  PRECAUTIONS: None  RED FLAGS: None   WEIGHT BEARING RESTRICTIONS: No  FALLS: Has patient fallen in last 6 months? No   PLOF: Independent  PATIENT GOALS: eliminate symptoms  OBJECTIVE:   TODAY'S TREATMENT: 07/30/23 Activity Comments  Left loaded Dix-Hallpike No dizziness no nystagmus  Discussion of self-mgmt with Brandt-Daroff if symptoms return and discussed procedure for return to clinic if needed                   TODAY'S TREATMENT: 07/28/23 Activity Comments  Loaded Left Dix-Hallpike Over pillow. Persistent left upbeating x 40 sec  Left Epley w/ vibration   Loaded Left Dix-Hallpike 8 sec latency, 20 sec small amplitude left upbeat  Left Epley w/ vibration   Loaded Left Dix-Hallpike Possibly a few small beats?  Left Semont Maneuver   Education for self-Dix-Hallpike and Brandt-Daroff for repositioning/habituation         TODAY'S TREATMENT: 07/22/23    M-CTSIB  Condition 1: Firm Surface, EO 30 Sec, Mild Sway  Condition 2: Firm Surface, EC 30 Sec, Mild and Moderate Sway  Condition 3: Foam Surface, EO 30 Sec, Moderate Sway  Condition 4: Foam Surface, EC 30 Sec, Moderate and Severe Sway     Activity Comments  FGA 27/30  L loaded DH L upbeating torsional nystagmus persisting however amplitude did decrease after ~40sec  L epley  Tolerated well; c/o some dizziness in position 3 but no nystagmus observed   L DH Latent persistent small amplitude L upbeating torsional nystagmus. C/o dizziness upon sitting up   L Brandt daroff x2 Tolerated well; mild dizziness upon sitting up           HOME EXERCISE PROGRAM Last updated: 07/22/23 Access Code: [email or web address] URL: [email or web address] Date: 07/22/2023 Prepared by: [facility] - Outpatient  Rehab - [facility]  Exercises - Brandt-Daroff Vestibular Exercise  -  1 x daily - 5 x weekly - 2 sets - 3-5 reps   PATIENT EDUCATION: Education details: edu on benefits of habituation; HEP with edu for safety and to perform with family assist Person educated: Patient Education method: Explanation, Demonstration, Tactile cues, Verbal cues, and Handouts Education comprehension: verbalized understanding and returned demonstration    Note: Objective measures were completed at Evaluation unless otherwise noted.  DIAGNOSTIC FINDINGS:     POSTURE:  No Significant postural limitations  Cervical ROM:    Active  A/PROM (deg) eval  Flexion WNL  Extension WNL  Right lateral flexion WNL  Left lateral flexion WNL  Right rotation WNL  Left rotation WNL  (Blank rows = not tested)  STRENGTH:   LOWER EXTREMITY MMT:   MMT Right eval Left eval  Hip flexion    Hip abduction    Hip adduction    Hip internal rotation    Hip external rotation    Knee flexion    Knee extension    Ankle dorsiflexion    Ankle plantarflexion    Ankle inversion    Ankle eversion    (Blank rows = not tested)    GAIT: Gait pattern: WFL Distance walked:  Assistive device utilized: None Level of assistance: Complete Independence Comments:    PATIENT SURVEYS:  DHI 40%  VESTIBULAR ASSESSMENT:  GENERAL OBSERVATION:    SYMPTOM BEHAVIOR:wears bifocals  Subjective history: on/off hx of positional dizziness, reports last significant episode was Apri-May  Non-Vestibular symptoms: none reported  Type of dizziness: Spinning/Vertigo  Frequency: comes and goes  Duration: seconds-minutes  Aggravating factors: Induced by position change: lying supine, rolling to the right, rolling to the left, and supine to sit and Induced by motion: looking up at the ceiling, bending down to the ground, turning body quickly, and turning head quickly  Relieving factors: slow movements  Progression of symptoms: better  OCULOMOTOR EXAM:  Ocular Alignment: normal  Ocular ROM: No Limitations  Spontaneous Nystagmus: absent  Gaze-Induced Nystagmus: absent  Smooth Pursuits: intact  Saccades: intact  Convergence/Divergence:   cm   VESTIBULAR - OCULAR REFLEX:   Slow VOR: Normal  VOR Cancellation: Comment: normal w/ some dizziness experienced  Head-Impulse Test: HIT Right: positive HIT Left: negative  Dynamic Visual Acuity: Not able to be assessed   POSITIONAL TESTING: Right Dix-Hallpike: no nystagmus Left Dix-Hallpike: upbeating, left nystagmus Right Roll Test: no nystagmus Left Roll Test: no nystagmus  MOTION  SENSITIVITY:  Motion Sensitivity Quotient Intensity: 0 = none, 1 = Lightheaded, 2 = Mild, 3 = Moderate, 4 = Severe, 5 = Vomiting  Intensity  1. Sitting to supine   2. Supine to L side   3. Supine to R side   4. Supine to sitting   5. L Hallpike-Dix   6. Up from L    7. R Hallpike-Dix   8. Up from R    9. Sitting, head tipped to L knee   10. Head up from L knee   11. Sitting, head tipped to R knee   12. Head up from R knee   13. Sitting head turns x5   14.Sitting head nods x5   15. In stance, 180 turn to L    16. In stance, 180 turn to R     OTHOSTATICS: not done  FUNCTIONAL GAIT: Functional gait assessment: TBD  M-CTSIB: TBD  TREATMENT DATE: 07/20/23   Canalith Repositioning:  Epley Left: Comment: 1 repetition   PATIENT EDUCATION: Education details: assessment details, intervention rationale Person educated: Patient Education method: Explanation Education comprehension: verbalized understanding  HOME EXERCISE PROGRAM:  GOALS: Goals reviewed with patient? Yes  SHORT TERM GOALS: Target date: same as LTG   LONG TERM GOALS: Target date: 08/18/2023    Patient will be independent in HEP to improve functional outcomes Baseline:  Goal status: IN PROGRESS  2.  Pt to report improved symptoms per score 22% DHI Baseline: 40% Goal status: IN PROGRESS  3.  Pt to be free of positional dizziness to reduce fall risk/balance impairments Baseline: +left Dix-Hallpike Goal status: IN PROGRESS   ASSESSMENT:  CLINICAL IMPRESSION: Pt reports improved symptoms without provocation to positional changes and she performed left Dix-Hallpike several times at home for self-assessment and notes no vertigo present in position or with bed mobility movements.  Discussed self-mgmt with Brandt-Daroff if symptoms return and procedure for return to clinic as we will  keep chart open x 30 days for f/u prn.    OBJECTIVE IMPAIRMENTS: decreased balance and dizziness.   ACTIVITY LIMITATIONS: bending and reach over head  PARTICIPATION LIMITATIONS: community activity and yard work  PERSONAL FACTORS: Age and Time since onset of injury/illness/exacerbation are also affecting patient's functional outcome.   REHAB POTENTIAL: Excellent  CLINICAL DECISION MAKING: Stable/uncomplicated  EVALUATION COMPLEXITY: Low   PLAN:  PT FREQUENCY: 1-2x/week  PT DURATION: 4 weeks  PLANNED INTERVENTIONS: 97750- Physical Performance Testing, 97110-Therapeutic exercises, 97530- Therapeutic activity, [id number]- Neuromuscular re-education, 97535- Self Care, 02859- Manual therapy, [id number]- Gait training, and [phone]- Canalith repositioning  PLAN FOR NEXT SESSION: anticipate D/C if no return to clinic    8:51 AM, 07/30/23 M. Kelly Aissa Lisowski, PT, DPT Physical Therapist- [place] Office Number: [phone]

## 2023-08-02 DIAGNOSIS — G8929 Other chronic pain: Secondary | ICD-10-CM | POA: Diagnosis not present

## 2023-08-02 DIAGNOSIS — M546 Pain in thoracic spine: Secondary | ICD-10-CM | POA: Diagnosis not present

## 2023-08-02 DIAGNOSIS — M47814 Spondylosis without myelopathy or radiculopathy, thoracic region: Secondary | ICD-10-CM | POA: Diagnosis not present

## 2023-08-04 DIAGNOSIS — H401131 Primary open-angle glaucoma, bilateral, mild stage: Secondary | ICD-10-CM | POA: Diagnosis not present

## 2023-08-04 DIAGNOSIS — H47231 Glaucomatous optic atrophy, right eye: Secondary | ICD-10-CM | POA: Diagnosis not present

## 2023-08-04 DIAGNOSIS — H53431 Sector or arcuate defects, right eye: Secondary | ICD-10-CM | POA: Diagnosis not present

## 2023-08-05 ENCOUNTER — Ambulatory Visit

## 2023-08-09 DIAGNOSIS — M546 Pain in thoracic spine: Secondary | ICD-10-CM | POA: Diagnosis not present

## 2023-08-09 DIAGNOSIS — M47814 Spondylosis without myelopathy or radiculopathy, thoracic region: Secondary | ICD-10-CM | POA: Diagnosis not present

## 2023-08-09 DIAGNOSIS — G8929 Other chronic pain: Secondary | ICD-10-CM | POA: Diagnosis not present

## 2023-08-10 DIAGNOSIS — G894 Chronic pain syndrome: Secondary | ICD-10-CM | POA: Diagnosis not present

## 2023-08-10 DIAGNOSIS — M47814 Spondylosis without myelopathy or radiculopathy, thoracic region: Secondary | ICD-10-CM | POA: Diagnosis not present

## 2023-08-12 ENCOUNTER — Encounter

## 2023-08-19 ENCOUNTER — Encounter

## 2023-08-26 DIAGNOSIS — G8929 Other chronic pain: Secondary | ICD-10-CM | POA: Diagnosis not present

## 2023-08-26 DIAGNOSIS — M47814 Spondylosis without myelopathy or radiculopathy, thoracic region: Secondary | ICD-10-CM | POA: Diagnosis not present

## 2023-08-26 DIAGNOSIS — M546 Pain in thoracic spine: Secondary | ICD-10-CM | POA: Diagnosis not present

## 2023-10-28 DIAGNOSIS — H401131 Primary open-angle glaucoma, bilateral, mild stage: Secondary | ICD-10-CM | POA: Diagnosis not present

## 2023-10-28 DIAGNOSIS — H47231 Glaucomatous optic atrophy, right eye: Secondary | ICD-10-CM | POA: Diagnosis not present

## 2023-10-28 DIAGNOSIS — H53431 Sector or arcuate defects, right eye: Secondary | ICD-10-CM | POA: Diagnosis not present

## 2023-12-02 DIAGNOSIS — M5416 Radiculopathy, lumbar region: Secondary | ICD-10-CM | POA: Diagnosis not present

## 2024-01-20 DIAGNOSIS — Z23 Encounter for immunization: Secondary | ICD-10-CM | POA: Diagnosis not present

## 2024-01-20 DIAGNOSIS — R030 Elevated blood-pressure reading, without diagnosis of hypertension: Secondary | ICD-10-CM | POA: Diagnosis not present

## 2024-01-20 DIAGNOSIS — E785 Hyperlipidemia, unspecified: Secondary | ICD-10-CM | POA: Diagnosis not present

## 2024-01-20 DIAGNOSIS — M810 Age-related osteoporosis without current pathological fracture: Secondary | ICD-10-CM | POA: Diagnosis not present

## 2024-01-20 DIAGNOSIS — E559 Vitamin D deficiency, unspecified: Secondary | ICD-10-CM | POA: Diagnosis not present

## 2024-01-20 DIAGNOSIS — Z0000 Encounter for general adult medical examination without abnormal findings: Secondary | ICD-10-CM | POA: Diagnosis not present

## 2024-01-21 ENCOUNTER — Other Ambulatory Visit (HOSPITAL_COMMUNITY): Payer: Self-pay | Admitting: Family Medicine

## 2024-01-21 DIAGNOSIS — R0789 Other chest pain: Secondary | ICD-10-CM

## 2024-02-08 ENCOUNTER — Ambulatory Visit (HOSPITAL_BASED_OUTPATIENT_CLINIC_OR_DEPARTMENT_OTHER)
Admission: RE | Admit: 2024-02-08 | Discharge: 2024-02-08 | Disposition: A | Discharge status: Home / Self Care | Payer: Self-pay | Source: Ambulatory Visit | Attending: Family Medicine | Admitting: Family Medicine

## 2024-02-08 DIAGNOSIS — R0789 Other chest pain: Secondary | ICD-10-CM | POA: Insufficient documentation
